# Patient Record
Sex: Male | Born: 1950 | Race: Black or African American | Hispanic: No | Marital: Single | Smoking: Never smoker
Health system: Southern US, Community
[De-identification: ages and names within clinical notes are randomized; demographics above are authoritative.]

## PROBLEM LIST (undated history)

## (undated) DIAGNOSIS — F419 Anxiety disorder, unspecified: Secondary | ICD-10-CM

## (undated) DIAGNOSIS — F0390 Unspecified dementia without behavioral disturbance: Secondary | ICD-10-CM

## (undated) DIAGNOSIS — I10 Essential (primary) hypertension: Secondary | ICD-10-CM

## (undated) DIAGNOSIS — F819 Developmental disorder of scholastic skills, unspecified: Secondary | ICD-10-CM

## (undated) DIAGNOSIS — R569 Unspecified convulsions: Secondary | ICD-10-CM

## (undated) HISTORY — PX: HIP SURGERY: SHX245

---

## 2015-11-27 ENCOUNTER — Emergency Department (HOSPITAL_BASED_OUTPATIENT_CLINIC_OR_DEPARTMENT_OTHER)
Admission: EM | Admit: 2015-11-27 | Discharge: 2015-11-27 | Disposition: A | Discharge status: Home / Self Care | Payer: Medicare Other | Attending: Emergency Medicine | Admitting: Emergency Medicine

## 2015-11-27 ENCOUNTER — Emergency Department (HOSPITAL_BASED_OUTPATIENT_CLINIC_OR_DEPARTMENT_OTHER): Payer: Medicare Other

## 2015-11-27 ENCOUNTER — Encounter (HOSPITAL_BASED_OUTPATIENT_CLINIC_OR_DEPARTMENT_OTHER): Payer: Self-pay | Admitting: *Deleted

## 2015-11-27 DIAGNOSIS — T17908A Unspecified foreign body in respiratory tract, part unspecified causing other injury, initial encounter: Secondary | ICD-10-CM

## 2015-11-27 DIAGNOSIS — Y929 Unspecified place or not applicable: Secondary | ICD-10-CM | POA: Insufficient documentation

## 2015-11-27 DIAGNOSIS — Y999 Unspecified external cause status: Secondary | ICD-10-CM | POA: Diagnosis not present

## 2015-11-27 DIAGNOSIS — Z79899 Other long term (current) drug therapy: Secondary | ICD-10-CM | POA: Insufficient documentation

## 2015-11-27 DIAGNOSIS — Y939 Activity, unspecified: Secondary | ICD-10-CM | POA: Diagnosis not present

## 2015-11-27 DIAGNOSIS — X58XXXA Exposure to other specified factors, initial encounter: Secondary | ICD-10-CM | POA: Insufficient documentation

## 2015-11-27 DIAGNOSIS — I10 Essential (primary) hypertension: Secondary | ICD-10-CM | POA: Diagnosis not present

## 2015-11-27 DIAGNOSIS — T17200A Unspecified foreign body in pharynx causing asphyxiation, initial encounter: Secondary | ICD-10-CM | POA: Insufficient documentation

## 2015-11-27 HISTORY — DX: Essential (primary) hypertension: I10

## 2015-11-27 HISTORY — DX: Developmental disorder of scholastic skills, unspecified: F81.9

## 2015-11-27 HISTORY — DX: Unspecified dementia, unspecified severity, without behavioral disturbance, psychotic disturbance, mood disturbance, and anxiety: F03.90

## 2015-11-27 HISTORY — DX: Anxiety disorder, unspecified: F41.9

## 2015-11-27 HISTORY — DX: Unspecified convulsions: R56.9

## 2015-11-27 MED ORDER — GLUCAGON HCL RDNA (DIAGNOSTIC) 1 MG IJ SOLR
1.0000 mg | Freq: Once | INTRAMUSCULAR | Status: AC
  Administered 2015-11-27: 1 mg via INTRAVENOUS
  Filled 2015-11-27: qty 1

## 2015-11-27 MED ORDER — PROPOFOL 10 MG/ML IV BOLUS
INTRAVENOUS | Status: AC
  Administered 2015-11-27: 18:00:00
  Filled 2015-11-27: qty 20

## 2015-11-27 MED ORDER — PROPOFOL 10 MG/ML IV BOLUS
INTRAVENOUS | Status: AC | PRN
Start: 2015-11-27 — End: 2015-11-27
  Administered 2015-11-27: 20 mg via INTRAVENOUS
  Administered 2015-11-27: 40 mg via INTRAVENOUS

## 2015-11-27 MED ORDER — GI COCKTAIL ~~LOC~~
30.0000 mL | Freq: Once | ORAL | Status: AC
  Administered 2015-11-27: 30 mL via ORAL
  Filled 2015-11-27: qty 30

## 2015-11-27 NOTE — ED Notes (Signed)
Pt tolerated ice cream. States he feels better. MD aware.

## 2015-11-27 NOTE — ED Notes (Signed)
Pt eating some ice cream

## 2015-11-27 NOTE — ED Notes (Signed)
Pt given water to drink. Tolerated well but states he still feels like something is in his throat. MD aware.

## 2015-11-27 NOTE — Sedation Documentation (Signed)
Unable to rate pain due to sedation procedure. Large piece of pork chop removed from airway

## 2015-11-27 NOTE — ED Notes (Signed)
MD with pt  

## 2015-11-27 NOTE — ED Provider Notes (Signed)
[facility]   CSN: [id number] Arrival date & time: 11/27/15  1718  First Provider Contact:  First MD Initiated Contact with Patient 11/27/15 1736   By signing my name below, I, Bridgette Habermann, attest that this documentation has been prepared under the direction and in the presence of Jacalyn Lefevre, MD. Electronically Signed: Bridgette Habermann, ED Scribe. 11/27/15. 5:41 PM.   History   Chief Complaint Chief Complaint  Patient presents with  . Airway Obstruction    HPI Comments: Hunter Chavez is a 65 y.o. male who presents to the Emergency Department complaining of a sudden onset, constant object stuck in his throat onset tonight. Pt also has associated cough. Pt states he was eating a pork chop this evening and got some stuck in his throat. Pt's voice is hoarse at this time. He denies vomiting, fever, or any other associated symptoms.  The history is provided by the patient. No language interpreter was used.   Past Medical History:  Diagnosis Date  . Anxiety   . Dementia   . Hypertension   . Learning disorder   . Seizures (HCC)     There are no active problems to display for this patient.  History reviewed. No pertinent surgical history.  Home Medications    Prior to Admission medications   Medication Sig Start Date End Date Taking? Authorizing Provider  diltiazem (CARDIZEM) 120 MG tablet Take 120 mg by mouth 4 (four) times daily.   Yes Historical Provider, MD  donepezil (ARICEPT) 10 MG tablet Take 10 mg by mouth at bedtime.   Yes Historical Provider, MD  famotidine (PEPCID) 10 MG tablet Take 10 mg by mouth 2 (two) times daily.   Yes Historical Provider, MD  levETIRAcetam (KEPPRA) 500 MG tablet Take 500 mg by mouth 2 (two) times daily.   Yes Historical Provider, MD  LORazepam (ATIVAN) 0.5 MG tablet Take 0.5 mg by mouth every 8 (eight) hours.   Yes Historical Provider, MD  pravastatin (PRAVACHOL) 20 MG tablet Take 20 mg by mouth daily.   Yes Historical  Provider, MD    Family History History reviewed. No pertinent family history.  Social History Social History  Substance Use Topics  . Smoking status: Never Smoker  . Smokeless tobacco: Never Used  . Alcohol use No   Allergies   Review of patient's allergies indicates no known allergies. Review of Systems Review of Systems  10 Systems reviewed and all are negative for acute change except as noted in the HPI. Physical Exam Updated Vital Signs BP 130/79 (BP Location: Left Arm)   Pulse 66   Temp 97.8 F (36.6 C) (Oral)   Resp 18   Ht  (1.626 m)   Wt 142 lb (64.4 kg)   SpO2 99%   BMI 24.37 kg/m   Physical Exam  Constitutional: He appears well-developed and well-nourished. He appears distressed.  HENT:  Head: Normocephalic.  Pt not handling secretions well  Eyes: Conjunctivae are normal.  Neck: Normal range of motion. Neck supple.  Cardiovascular: Normal rate.   Pulmonary/Chest: Effort normal. No respiratory distress.  Abdominal: Soft. Bowel sounds are normal. He exhibits no distension.  Musculoskeletal: Normal range of motion.  Neurological: He is alert.  Skin: Skin is warm and dry.  Psychiatric: He has a normal mood and affect. His behavior is normal.  Nursing note and vitals reviewed.  ED Treatments / Results  DIAGNOSTIC STUDIES: Oxygen Saturation is 93% on RA, poor by my interpretation.  COORDINATION OF CARE: 5:39 PM Discussed treatment plan with pt at bedside and pt agreed to plan.  Labs (all labs ordered are listed, but only abnormal results are displayed) Labs Reviewed - No data to display  EKG  EKG Interpretation None      Radiology Dg Chest Portable 1 View  Result Date: 11/27/2015 CLINICAL DATA:  Shortness of breath. Patient presented with object (Pork chop) stuck in throat, object has been removed. EXAM: PORTABLE CHEST 1 VIEW COMPARISON:  None. FINDINGS: Lung volumes are low. The patient is rotated to the right. Crowding of bronchovascular  markings with probable vascular congestion. Heart appears prominent in size, difficult to accurately assess due to positioning. No focal airspace disease. No large pleural effusion or pneumothorax. Gaseous distention of bowel loops in the upper abdomen is partially included. IMPRESSION: Low lung volumes with bronchovascular crowding and probable vascular congestion. Repeat PA and lateral views may be helpful when patient is able for accurate assessment of the cardiac size based on clinical concern. Electronically Signed   By: Rubye Oaks M.D.   On: 11/27/2015 18:23   Procedures .Foreign Body Removal Date/Time: 11/27/2015 6:58 PM Performed by: Jacalyn Lefevre Authorized by: Jacalyn Lefevre  Consent: Written consent obtained. Risks and benefits: risks, benefits and alternatives were discussed Consent given by: guardian Patient understanding: patient states understanding of the procedure being performed Patient consent: the patient's understanding of the procedure matches consent given Procedure consent: procedure consent matches procedure scheduled Relevant documents: relevant documents present and verified Test results: test results available and properly labeled Site marked: the operative site was marked Imaging studies: imaging studies available Required items: required blood products, implants, devices, and special equipment available Patient identity confirmed: verbally with patient and arm band Time out: Immediately prior to procedure a "time out" was called to verify the correct patient, procedure, equipment, support staff and site/side marked as required. Body area: throat  Sedation: Patient sedated: yes Sedation type: moderate (conscious) sedation Sedatives: propofol  Localization method: visualized Removal mechanism: ring forceps Complexity: complex 1 objects recovered. Objects recovered: large piece of pork chop from upper airway Post-procedure assessment: foreign body  removed Patient tolerance: Patient tolerated the procedure well with no immediate complications Comments: Please see nurse's notes for start and stop times. .Sedation Date/Time: 11/27/2015 7:00 PM Performed by: Jacalyn Lefevre Authorized by: Jacalyn Lefevre   Consent:    Consent obtained:  Written   Consent given by:  Guardian   Risks discussed:  Allergic reaction, dysrhythmia, inadequate sedation, nausea, prolonged hypoxia resulting in organ damage, prolonged sedation necessitating reversal, respiratory compromise necessitating ventilatory assistance and intubation and vomiting   Alternatives discussed:  Analgesia without sedation Universal protocol:    Procedure explained and questions answered to patient or proxy's satisfaction: yes     Relevant documents present and verified: yes     Test results available and properly labeled: yes     Imaging studies available: yes     Required blood products, implants, devices, and special equipment available: yes     Site/side marked: yes     Immediately prior to procedure a time out was called: yes     Patient identity confirmation method:  Verbally with patient Indications:    Procedure performed:  Foreign body removal   Procedure necessitating sedation performed by:  Physician performing sedation   Intended level of sedation:  Moderate (conscious sedation) Pre-sedation assessment:    NPO status caution: urgency dictates proceeding with non-ideal NPO status   Immediate pre-procedure details:  Reassessment: Patient reassessed immediately prior to procedure     Reviewed: vital signs and relevant labs/tests     Verified: bag valve mask available, emergency equipment available, intubation equipment available, IV patency confirmed and oxygen available   Post-procedure details:    Patient tolerance:  Tolerated well, no immediate complications Comments:     Please see nurse's note for start and stop times.   (including critical care  time)  Medications Ordered in ED Medications  propofol (DIPRIVAN) 10 mg/mL bolus/IV push (  New Bag/Given 11/27/15 1818)  propofol (DIPRIVAN) 10 mg/mL bolus/IV push (20 mg Intravenous New Bag/Given 11/27/15 1758)  glucagon (human recombinant) (GLUCAGEN) injection 1 mg (1 mg Intravenous Given 11/27/15 1845)  gi cocktail (Maalox,Lidocaine,Donnatal) (30 mLs Oral Given 11/27/15 1910)     Initial Impression / Assessment and Plan / ED Course  I have reviewed the triage vital signs and the nursing notes.  Pertinent labs & imaging results that were available during my care of the patient were reviewed by me and considered in my medical decision making (see chart for details).  Clinical Course   After I removed the pork chop from pt's airway, I looked with the glide scope and did not see any residual pork chop.   When pt woke up, he felt like something was still stuck.  I was confident it was not in the airway, so we gave him glucagon and a gi cocktail.  The pt was then able to swallow normally.  He has been able to eat ice cream and drink fluids.  He is now singing in the room and said that he feels good.  He is in no resp distress.  He is ok for d/c.  Pt is encouraged to chew food thoroughly.   Final Clinical Impressions(s) / ED Diagnoses   Final diagnoses:  Airway obstruction due to foreign body, initial encounter    New Prescriptions New Prescriptions   No medications on file  I personally performed the services described in this documentation, which was scribed in my presence. The recorded information has been reviewed and is accurate.     Jacalyn Lefevre, MD 11/27/15 2001

## 2015-11-27 NOTE — Sedation Documentation (Signed)
Portable xray at bedside. Family at bedside during procedure.

## 2015-11-27 NOTE — ED Triage Notes (Signed)
Pt from home.  States that he was eating a pork chop earlier tonight and got some stuck in his throat.  pts voice hoarse.  Pt is able to swallow liquids but appears to have difficulty.  Pt coughing.

## 2019-05-08 ENCOUNTER — Encounter (HOSPITAL_BASED_OUTPATIENT_CLINIC_OR_DEPARTMENT_OTHER): Payer: Self-pay

## 2019-05-08 ENCOUNTER — Other Ambulatory Visit: Payer: Self-pay

## 2019-05-08 ENCOUNTER — Emergency Department (HOSPITAL_BASED_OUTPATIENT_CLINIC_OR_DEPARTMENT_OTHER): Payer: Medicare Other

## 2019-05-08 ENCOUNTER — Emergency Department (HOSPITAL_BASED_OUTPATIENT_CLINIC_OR_DEPARTMENT_OTHER)
Admission: EM | Admit: 2019-05-08 | Discharge: 2019-05-09 | Payer: Medicare Other | Attending: Emergency Medicine | Admitting: Emergency Medicine

## 2019-05-08 DIAGNOSIS — I10 Essential (primary) hypertension: Secondary | ICD-10-CM | POA: Insufficient documentation

## 2019-05-08 DIAGNOSIS — Y999 Unspecified external cause status: Secondary | ICD-10-CM | POA: Diagnosis not present

## 2019-05-08 DIAGNOSIS — Y939 Activity, unspecified: Secondary | ICD-10-CM | POA: Insufficient documentation

## 2019-05-08 DIAGNOSIS — S72114A Nondisplaced fracture of greater trochanter of right femur, initial encounter for closed fracture: Secondary | ICD-10-CM | POA: Diagnosis not present

## 2019-05-08 DIAGNOSIS — F0390 Unspecified dementia without behavioral disturbance: Secondary | ICD-10-CM | POA: Insufficient documentation

## 2019-05-08 DIAGNOSIS — S79911A Unspecified injury of right hip, initial encounter: Secondary | ICD-10-CM | POA: Diagnosis present

## 2019-05-08 DIAGNOSIS — W1811XA Fall from or off toilet without subsequent striking against object, initial encounter: Secondary | ICD-10-CM | POA: Insufficient documentation

## 2019-05-08 DIAGNOSIS — Z79899 Other long term (current) drug therapy: Secondary | ICD-10-CM | POA: Insufficient documentation

## 2019-05-08 DIAGNOSIS — Y929 Unspecified place or not applicable: Secondary | ICD-10-CM | POA: Diagnosis not present

## 2019-05-08 DIAGNOSIS — S72001A Fracture of unspecified part of neck of right femur, initial encounter for closed fracture: Secondary | ICD-10-CM

## 2019-05-08 LAB — CBC WITH DIFFERENTIAL/PLATELET
Abs Immature Granulocytes: 0.02 10*3/uL (ref 0.00–0.07)
Basophils Absolute: 0 10*3/uL (ref 0.0–0.1)
Basophils Relative: 0 %
Eosinophils Absolute: 0.2 10*3/uL (ref 0.0–0.5)
Eosinophils Relative: 4 %
HCT: 41.2 % (ref 39.0–52.0)
Hemoglobin: 12.5 g/dL — ABNORMAL LOW (ref 13.0–17.0)
Immature Granulocytes: 0 %
Lymphocytes Relative: 30 %
Lymphs Abs: 1.9 10*3/uL (ref 0.7–4.0)
MCH: 27.2 pg (ref 26.0–34.0)
MCHC: 30.3 g/dL (ref 30.0–36.0)
MCV: 89.8 fL (ref 80.0–100.0)
Monocytes Absolute: 0.9 10*3/uL (ref 0.1–1.0)
Monocytes Relative: 14 %
Neutro Abs: 3.2 10*3/uL (ref 1.7–7.7)
Neutrophils Relative %: 52 %
Platelets: 107 10*3/uL — ABNORMAL LOW (ref 150–400)
RBC: 4.59 MIL/uL (ref 4.22–5.81)
RDW: 13.2 % (ref 11.5–15.5)
WBC: 6.2 10*3/uL (ref 4.0–10.5)
nRBC: 0 % (ref 0.0–0.2)

## 2019-05-08 LAB — BASIC METABOLIC PANEL
Anion gap: 10 (ref 5–15)
BUN: 15 mg/dL (ref 8–23)
CO2: 25 mmol/L (ref 22–32)
Calcium: 9.4 mg/dL (ref 8.9–10.3)
Chloride: 109 mmol/L (ref 98–111)
Creatinine, Ser: 1.19 mg/dL (ref 0.61–1.24)
GFR calc Af Amer: >60 mL/min (ref >60)
GFR calc non Af Amer: >60 mL/min (ref >60)
Glucose, Bld: 107 mg/dL — ABNORMAL HIGH (ref 70–99)
Potassium: 4.1 mmol/L (ref 3.5–5.1)
Sodium: 144 mmol/L (ref 135–145)

## 2019-05-08 MED ORDER — FENTANYL CITRATE (PF) 100 MCG/2ML IJ SOLN
50.0000 ug | Freq: Once | INTRAMUSCULAR | Status: AC
  Administered 2019-05-08: 50 ug via INTRAVENOUS
  Filled 2019-05-08: qty 2

## 2019-05-08 NOTE — ED Notes (Signed)
Patient transported to X-ray 

## 2019-05-08 NOTE — ED Triage Notes (Addendum)
Pt was being assisted to toilet by care giver and fell to hit his R side. Pt is non-verbal, but follows direction. Caregiver denies that pt hit his head. No LOC.

## 2019-05-08 NOTE — ED Provider Notes (Signed)
[facility] EMERGENCY DEPARTMENT Provider Note   CSN: [id number] Arrival date & time: 05/08/19  1606     History Chief Complaint  Patient presents with  . Fall    Hunter Chavez is a 69 y.o. male.  HPI Patient presents to the emergency department with injuries following a fall.  The patient's sister gives me the history as he is nonverbal.  The patient was attempting to use the bathroom when he missed the toilet and he started to fall and his sister tried to catch him but was unable to do so and he fell landing on his right hip.  The patient was unable to stand after the incident.  The sister states that he did not hit his head.  And did not lose consciousness.    Past Medical History:  Diagnosis Date  . Anxiety   . Dementia ([place])   . Hypertension   . Learning disorder   . Seizures ([place])     There are no problems to display for this patient.   History reviewed. No pertinent surgical history.     No family history on file.  Social History   Tobacco Use  . Smoking status: Never Smoker  . Smokeless tobacco: Never Used  Substance Use Topics  . Alcohol use: No  . Drug use: No    Home Medications Prior to Admission medications   Medication Sig Start Date End Date Taking? Authorizing Provider  diltiazem (CARDIZEM) 120 MG tablet Take 120 mg by mouth 4 (four) times daily.    [provider]  donepezil (ARICEPT) 10 MG tablet Take 10 mg by mouth at bedtime.    [provider]  famotidine (PEPCID) 10 MG tablet Take 10 mg by mouth 2 (two) times daily.    [provider]  levETIRAcetam (KEPPRA) 500 MG tablet Take 500 mg by mouth 2 (two) times daily.    [provider]  LORazepam (ATIVAN) 0.5 MG tablet Take 0.5 mg by mouth every 8 (eight) hours.    [provider]  pravastatin (PRAVACHOL) 20 MG tablet Take 20 mg by mouth daily.    [provider]    Allergies    Patient has no known allergies.  Review of  Systems   Review of Systems Level 5 caveat applies due to nonverbal chronic condition Physical Exam Updated Vital Signs BP (!) 157/65 (BP Location: Right Arm)   Pulse (!) 58   Temp 98 F (36.7 C) (Tympanic)   Resp 20   Ht 5\' 4"  (1.626 m)   Wt 49.9 kg   SpO2 99%   BMI 18.88 kg/m   Physical Exam Vitals and nursing note reviewed.  Constitutional:      General: He is not in acute distress.    Appearance: He is well-developed.  HENT:     Head: Normocephalic and atraumatic.  Eyes:     Pupils: Pupils are equal, round, and reactive to light.  Cardiovascular:     Rate and Rhythm: Normal rate and regular rhythm.     Heart sounds: Normal heart sounds. No murmur. No friction rub. No gallop.   Pulmonary:     Effort: Pulmonary effort is normal. No respiratory distress.     Breath sounds: Normal breath sounds. No wheezing.  Abdominal:     General: Bowel sounds are normal. There is no distension.     Palpations: Abdomen is soft.     Tenderness: There is no abdominal tenderness.  Musculoskeletal:  Cervical back: Normal range of motion and neck supple.     Right hip: Tenderness and bony tenderness present.  Skin:    General: Skin is warm and dry.     Capillary Refill: Capillary refill takes less than 2 seconds.     Findings: No erythema or rash.  Neurological:     Mental Status: He is alert. Mental status is at baseline.     Motor: No abnormal muscle tone.     Coordination: Coordination normal.  Psychiatric:        Behavior: Behavior normal.     ED Results / Procedures / Treatments   Labs (all labs ordered are listed, but only abnormal results are displayed) Labs Reviewed  BASIC METABOLIC PANEL - Abnormal; Notable for the following components:      Result Value   Glucose, Bld 107 (*)    All other components within normal limits  CBC WITH DIFFERENTIAL/PLATELET - Abnormal; Notable for the following components:   Hemoglobin 12.5 (*)    Platelets 107 (*)    All other  components within normal limits    EKG None  Radiology DG Hip Unilat W or Wo Pelvis 2-3 Views Right  Result Date: 05/08/2019 CLINICAL DATA:  Fall, hip pain EXAM: DG HIP (WITH OR WITHOUT PELVIS) 2-3V RIGHT COMPARISON:  None. FINDINGS: There is a linear lucency seen through the greater trochanter, likely nondisplaced fracture. 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 Normal bone mineralization seen throughout. IMPRESSION: Nondisplaced superior greater trochanter fracture. Electronically Signed   By: Jonna Clark M.D.   On: 05/08/2019 20:11    Procedures Procedures (including critical care time)  Medications Ordered in ED Medications - No data to display  ED Course  I have reviewed the triage vital signs and the nursing notes.  Pertinent labs & imaging results that were available during my care of the patient were reviewed by me and considered in my medical decision making (see chart for details).    MDM Rules/Calculators/A&P                     I spoke with who is on-call for the patient's orthopedist at [facility] and they will accept him in transfer to the emergency department.  Spoke with the emergency department who coordinated with the surgeon and would like for him brought to the ER for surgery tonight.  Final Clinical Impression(s) / ED Diagnoses Final diagnoses:  None    Rx / DC Orders ED Discharge Orders    None       Charlestine Night, PA-C 05/08/19 2255    Little, Ambrose Finland, MD 05/09/19 1821

## 2019-11-25 ENCOUNTER — Emergency Department (HOSPITAL_BASED_OUTPATIENT_CLINIC_OR_DEPARTMENT_OTHER): Payer: Medicare Other

## 2019-11-25 ENCOUNTER — Encounter (HOSPITAL_BASED_OUTPATIENT_CLINIC_OR_DEPARTMENT_OTHER): Payer: Self-pay

## 2019-11-25 ENCOUNTER — Other Ambulatory Visit: Payer: Self-pay

## 2019-11-25 ENCOUNTER — Inpatient Hospital Stay (HOSPITAL_BASED_OUTPATIENT_CLINIC_OR_DEPARTMENT_OTHER)
Admission: EM | Admit: 2019-11-25 | Discharge: 2019-11-29 | DRG: 177 | Disposition: A | Discharge status: Home / Self Care | Payer: Medicare Other | Attending: Internal Medicine | Admitting: Internal Medicine

## 2019-11-25 DIAGNOSIS — G2111 Neuroleptic induced parkinsonism: Secondary | ICD-10-CM | POA: Diagnosis present

## 2019-11-25 DIAGNOSIS — I10 Essential (primary) hypertension: Secondary | ICD-10-CM | POA: Diagnosis present

## 2019-11-25 DIAGNOSIS — G20 Parkinson's disease: Secondary | ICD-10-CM | POA: Diagnosis present

## 2019-11-25 DIAGNOSIS — J690 Pneumonitis due to inhalation of food and vomit: Secondary | ICD-10-CM | POA: Diagnosis present

## 2019-11-25 DIAGNOSIS — F0280 Dementia in other diseases classified elsewhere without behavioral disturbance: Secondary | ICD-10-CM | POA: Diagnosis present

## 2019-11-25 DIAGNOSIS — F0390 Unspecified dementia without behavioral disturbance: Secondary | ICD-10-CM | POA: Diagnosis present

## 2019-11-25 DIAGNOSIS — L89101 Pressure ulcer of unspecified part of back, stage 1: Secondary | ICD-10-CM

## 2019-11-25 DIAGNOSIS — T43505A Adverse effect of unspecified antipsychotics and neuroleptics, initial encounter: Secondary | ICD-10-CM | POA: Diagnosis present

## 2019-11-25 DIAGNOSIS — N179 Acute kidney failure, unspecified: Secondary | ICD-10-CM | POA: Diagnosis present

## 2019-11-25 DIAGNOSIS — L8990 Pressure ulcer of unspecified site, unspecified stage: Secondary | ICD-10-CM | POA: Insufficient documentation

## 2019-11-25 DIAGNOSIS — J9601 Acute respiratory failure with hypoxia: Secondary | ICD-10-CM | POA: Diagnosis present

## 2019-11-25 DIAGNOSIS — Z833 Family history of diabetes mellitus: Secondary | ICD-10-CM

## 2019-11-25 DIAGNOSIS — R739 Hyperglycemia, unspecified: Secondary | ICD-10-CM | POA: Diagnosis not present

## 2019-11-25 DIAGNOSIS — F419 Anxiety disorder, unspecified: Secondary | ICD-10-CM | POA: Diagnosis present

## 2019-11-25 DIAGNOSIS — F29 Unspecified psychosis not due to a substance or known physiological condition: Secondary | ICD-10-CM | POA: Diagnosis present

## 2019-11-25 DIAGNOSIS — Z20822 Contact with and (suspected) exposure to covid-19: Secondary | ICD-10-CM | POA: Diagnosis present

## 2019-11-25 DIAGNOSIS — Z8249 Family history of ischemic heart disease and other diseases of the circulatory system: Secondary | ICD-10-CM

## 2019-11-25 DIAGNOSIS — R1110 Vomiting, unspecified: Secondary | ICD-10-CM | POA: Diagnosis present

## 2019-11-25 DIAGNOSIS — G40909 Epilepsy, unspecified, not intractable, without status epilepticus: Secondary | ICD-10-CM | POA: Diagnosis present

## 2019-11-25 DIAGNOSIS — E876 Hypokalemia: Secondary | ICD-10-CM | POA: Diagnosis present

## 2019-11-25 DIAGNOSIS — L89151 Pressure ulcer of sacral region, stage 1: Secondary | ICD-10-CM | POA: Diagnosis present

## 2019-11-25 DIAGNOSIS — Z79899 Other long term (current) drug therapy: Secondary | ICD-10-CM

## 2019-11-25 DIAGNOSIS — R0902 Hypoxemia: Secondary | ICD-10-CM

## 2019-11-25 DIAGNOSIS — E869 Volume depletion, unspecified: Secondary | ICD-10-CM | POA: Diagnosis present

## 2019-11-25 DIAGNOSIS — F79 Unspecified intellectual disabilities: Secondary | ICD-10-CM | POA: Diagnosis present

## 2019-11-25 LAB — CBC WITH DIFFERENTIAL/PLATELET
Abs Immature Granulocytes: 0.14 10*3/uL — ABNORMAL HIGH (ref 0.00–0.07)
Basophils Absolute: 0 10*3/uL (ref 0.0–0.1)
Basophils Relative: 0 %
Eosinophils Absolute: 0 10*3/uL (ref 0.0–0.5)
Eosinophils Relative: 0 %
HCT: 44.3 % (ref 39.0–52.0)
Hemoglobin: 13.7 g/dL (ref 13.0–17.0)
Immature Granulocytes: 1 %
Lymphocytes Relative: 8 %
Lymphs Abs: 1 10*3/uL (ref 0.7–4.0)
MCH: 26.7 pg (ref 26.0–34.0)
MCHC: 30.9 g/dL (ref 30.0–36.0)
MCV: 86.4 fL (ref 80.0–100.0)
Monocytes Absolute: 0.6 10*3/uL (ref 0.1–1.0)
Monocytes Relative: 4 %
Neutro Abs: 11.3 10*3/uL — ABNORMAL HIGH (ref 1.7–7.7)
Neutrophils Relative %: 87 %
Platelets: 160 10*3/uL (ref 150–400)
RBC: 5.13 MIL/uL (ref 4.22–5.81)
RDW: 12.5 % (ref 11.5–15.5)
WBC: 13 10*3/uL — ABNORMAL HIGH (ref 4.0–10.5)
nRBC: 0 % (ref 0.0–0.2)

## 2019-11-25 LAB — BASIC METABOLIC PANEL
Anion gap: 14 (ref 5–15)
BUN: 18 mg/dL (ref 8–23)
CO2: 27 mmol/L (ref 22–32)
Calcium: 9.6 mg/dL (ref 8.9–10.3)
Chloride: 100 mmol/L (ref 98–111)
Creatinine, Ser: 1.49 mg/dL — ABNORMAL HIGH (ref 0.61–1.24)
GFR calc Af Amer: 56 mL/min — ABNORMAL LOW (ref >60)
GFR calc non Af Amer: 48 mL/min — ABNORMAL LOW (ref >60)
Glucose, Bld: 197 mg/dL — ABNORMAL HIGH (ref 70–99)
Potassium: 3.9 mmol/L (ref 3.5–5.1)
Sodium: 141 mmol/L (ref 135–145)

## 2019-11-25 LAB — I-STAT ARTERIAL BLOOD GAS, ED
Acid-Base Excess: 0 mmol/L (ref 0.0–2.0)
Bicarbonate: 27.4 mmol/L (ref 20.0–28.0)
Calcium, Ion: 1.35 mmol/L (ref 1.15–1.40)
HCT: 40 % (ref 39.0–52.0)
Hemoglobin: 13.6 g/dL (ref 13.0–17.0)
O2 Saturation: 86 %
Patient temperature: 97.6
Potassium: 3.7 mmol/L (ref 3.5–5.1)
Sodium: 143 mmol/L (ref 135–145)
TCO2: 29 mmol/L (ref 22–32)
pCO2 arterial: 54.4 mmHg — ABNORMAL HIGH (ref 32.0–48.0)
pH, Arterial: 7.307 — ABNORMAL LOW (ref 7.350–7.450)
pO2, Arterial: 56 mmHg — ABNORMAL LOW (ref 83.0–108.0)

## 2019-11-25 LAB — LACTIC ACID, PLASMA: Lactic Acid, Venous: 2.3 mmol/L (ref 0.5–1.9)

## 2019-11-25 MED ORDER — METOCLOPRAMIDE HCL 5 MG/ML IJ SOLN
10.0000 mg | Freq: Once | INTRAMUSCULAR | Status: AC
  Administered 2019-11-25: 10 mg via INTRAVENOUS
  Filled 2019-11-25: qty 2

## 2019-11-25 MED ORDER — DIPHENHYDRAMINE HCL 50 MG/ML IJ SOLN
12.5000 mg | Freq: Once | INTRAMUSCULAR | Status: AC
  Administered 2019-11-25: 12.5 mg via INTRAVENOUS
  Filled 2019-11-25: qty 1

## 2019-11-25 MED ORDER — LIDOCAINE VISCOUS HCL 2 % MT SOLN
15.0000 mL | Freq: Once | OROMUCOSAL | Status: AC
Start: 2019-11-25 — End: 2019-11-25
  Administered 2019-11-25: 15 mL via ORAL
  Filled 2019-11-25: qty 15

## 2019-11-25 MED ORDER — SODIUM CHLORIDE 0.9 % IV SOLN
3.0000 g | Freq: Once | INTRAVENOUS | Status: AC
  Administered 2019-11-25: 3 g via INTRAVENOUS
  Filled 2019-11-25: qty 8

## 2019-11-25 MED ORDER — LEVOFLOXACIN 500 MG PO TABS
500.0000 mg | ORAL_TABLET | Freq: Once | ORAL | Status: AC
  Administered 2019-11-25: 500 mg via ORAL
  Filled 2019-11-25: qty 1

## 2019-11-25 MED ORDER — SODIUM CHLORIDE 0.9 % IV BOLUS
1000.0000 mL | Freq: Once | INTRAVENOUS | Status: AC
  Administered 2019-11-25: 1000 mL via INTRAVENOUS

## 2019-11-25 MED ORDER — ALUM & MAG HYDROXIDE-SIMETH 200-200-20 MG/5ML PO SUSP
30.0000 mL | Freq: Once | ORAL | Status: AC
  Administered 2019-11-25: 30 mL via ORAL
  Filled 2019-11-25: qty 30

## 2019-11-25 MED ORDER — GLUCAGON HCL RDNA (DIAGNOSTIC) 1 MG IJ SOLR
1.0000 mg | Freq: Once | INTRAMUSCULAR | Status: AC
  Administered 2019-11-25: 1 mg via INTRAVENOUS
  Filled 2019-11-25: qty 1

## 2019-11-25 NOTE — ED Notes (Addendum)
Date and time results received: 11/25/19 (use smartphrase ".now" to insert current time)  Test: lactic Critical Value: 2.3  Name of Provider Notified:[place]

## 2019-11-25 NOTE — ED Notes (Signed)
Patient tolerated PO challenge well.  

## 2019-11-25 NOTE — ED Provider Notes (Signed)
[facility] EMERGENCY DEPARTMENT Provider Note   CSN: [id number] Arrival date & time: 11/25/19  2016     History No chief complaint on file.   Hunter Chavez is a 69 y.o. male history of Parkinson's, hypertension, chronic aspiration here presenting with possible aspiration or choking event.  Patient was feeding himself a hot dog and apparently did not chew his food and may have aspirated.  Patient apparently had previous aspiration events.  Patient had no fever at home.  The history is provided by the patient and a relative.       Past Medical History:  Diagnosis Date  . Anxiety   . Dementia (HCC)   . Hypertension   . Learning disorder   . Seizures (HCC)     There are no problems to display for this patient.   History reviewed. No pertinent surgical history.     No family history on file.  Social History   Tobacco Use  . Smoking status: Never Smoker  . Smokeless tobacco: Never Used  Vaping Use  . Vaping Use: Never used  Substance Use Topics  . Alcohol use: No  . Drug use: No    Home Medications Prior to Admission medications   Medication Sig Start Date End Date Taking? Authorizing Provider  diltiazem (CARDIZEM) 120 MG tablet Take 120 mg by mouth 4 (four) times daily.    [provider]  donepezil (ARICEPT) 10 MG tablet Take 10 mg by mouth at bedtime.    [provider]  famotidine (PEPCID) 10 MG tablet Take 10 mg by mouth 2 (two) times daily.    [provider]  levETIRAcetam (KEPPRA) 500 MG tablet Take 500 mg by mouth 2 (two) times daily.    [provider]  LORazepam (ATIVAN) 0.5 MG tablet Take 0.5 mg by mouth every 8 (eight) hours.    [provider]  pravastatin (PRAVACHOL) 20 MG tablet Take 20 mg by mouth daily.    [provider]    Allergies    Patient has no known allergies.  Review of Systems   Review of Systems  Respiratory: Positive for choking.   All other systems reviewed and  are negative.   Physical Exam Updated Vital Signs BP (!) 150/82 (BP Location: Right Arm)   Temp 97.6 F (36.4 C) (Tympanic)   Resp 20   Wt 47.8 kg   SpO2 99%   BMI 18.09 kg/m   Physical Exam Vitals and nursing note reviewed.  Constitutional:      Comments: Chronically ill, gurgling.  HENT:     Head: Normocephalic.     Nose: Nose normal.     Mouth/Throat:     Comments: No obvious foreign body in posterior pharynx. Eyes:     Extraocular Movements: Extraocular movements intact.     Pupils: Pupils are equal, round, and reactive to light.  Cardiovascular:     Rate and Rhythm: Normal rate and regular rhythm.     Pulses: Normal pulses.     Heart sounds: Normal heart sounds.  Pulmonary:     Effort: Pulmonary effort is normal.     Comments: Crackles on right lung base. Abdominal:     General: Abdomen is flat.     Palpations: Abdomen is soft.  Musculoskeletal:        General: Normal range of motion.     Cervical back: Normal range of motion and neck supple.  Skin:    General: Skin is warm.  Capillary Refill: Capillary refill takes less than 2 seconds.  Neurological:     Mental Status: He is alert.     Comments: Some baseline tremors.  Patient is minimally verbal.  Psychiatric:        Mood and Affect: Mood normal.     ED Results / Procedures / Treatments   Labs (all labs ordered are listed, but only abnormal results are displayed) Labs Reviewed  CBC WITH DIFFERENTIAL/PLATELET - Abnormal; Notable for the following components:      Result Value   WBC 13.0 (*)    Neutro Abs 11.3 (*)    Abs Immature Granulocytes 0.14 (*)    All other components within normal limits  BASIC METABOLIC PANEL - Abnormal; Notable for the following components:   Glucose, Bld 197 (*)    Creatinine, Ser 1.49 (*)    GFR calc non Af Amer 48 (*)    GFR calc Af Amer 56 (*)    All other components within normal limits    EKG None  Radiology No results found.  Procedures Procedures  (including critical care time)  CRITICAL CARE Performed by: Richardean Canal   Total critical care time: 30 minutes  Critical care time was exclusive of separately billable procedures and treating other patients.  Critical care was necessary to treat or prevent imminent or life-threatening deterioration.  Critical care was time spent personally by me on the following activities: development of treatment plan with patient and/or surrogate as well as nursing, discussions with consultants, evaluation of patient's response to treatment, examination of patient, obtaining history from patient or surrogate, ordering and performing treatments and interventions, ordering and review of laboratory studies, ordering and review of radiographic studies, pulse oximetry and re-evaluation of patient's condition.   Medications Ordered in ED Medications  alum & mag hydroxide-simeth (MAALOX/MYLANTA) 200-200-20 MG/5ML suspension 30 mL (has no administration in time range)    And  lidocaine (XYLOCAINE) 2 % viscous mouth solution 15 mL (has no administration in time range)  glucagon (human recombinant) (GLUCAGEN) injection 1 mg (1 mg Intravenous Given 11/25/19 2104)  metoCLOPramide (REGLAN) injection 10 mg (10 mg Intravenous Given 11/25/19 2104)  diphenhydrAMINE (BENADRYL) injection 12.5 mg (12.5 mg Intravenous Given 11/25/19 2104)  sodium chloride 0.9 % bolus 1,000 mL (1,000 mLs Intravenous New Bag/Given 11/25/19 2123)    ED Course  I have reviewed the triage vital signs and the nursing notes.  Pertinent labs & imaging results that were available during my care of the patient were reviewed by me and considered in my medical decision making (see chart for details).    MDM Rules/Calculators/A&P                          Hunter Chavez is a 69 y.o. male here presenting with cough and choking event.  Patient does have history of aspiration previously.  I wonder if he had another aspiration event or had globus  sensation or had food impaction.  We will try to get a chest x-ray to rule out aspiration.  We will get CBC and BMP as well.  We will try to give some glucagon and Reglan and p.o. trial.  10:30 pm Patient started desatted into the past 78%.  Patient is able to swallow however.  His chest x-ray showed bilateral pneumonia.  His white blood cell count is elevated at 13.  I think he likely has chronic aspiration and aspirated again today.  Family requested [facility] so we will page [facility].  11:15 pm  I talked to [facility] hospitalist (Dr. Luciano Cutter).  There is no beds at [facility].  Will admit to hospitalist in the [facility] system    Final Clinical Impression(s) / ED Diagnoses Final diagnoses:  None    Rx / DC Orders ED Discharge Orders    None       Charlynne Pander, MD 11/25/19 2345

## 2019-11-25 NOTE — ED Notes (Signed)
  Patient SPO2 was reading low on central monitor.  Went to assess patient and he was tachypneic and SPO2 was 78% on room air.  Tried adjusted patient and put him on 3 L O2 via [place].  SPO2 came up to 92-94% on O2.  Dr Silverio Lay was notified and went to assess patient.  Brett Canales RT also came to evaluate patient.

## 2019-11-25 NOTE — Progress Notes (Signed)
Patient's ABG  O2 saturation on 3 liter nasal is 86%.  Placed patient on 10 liter high flo nasal cannula.  Patient's SPO2 with a good waveform has increased to 96%.  MD aware.  RT will continue to monitor.

## 2019-11-25 NOTE — ED Triage Notes (Addendum)
Per pt's brother in law/pt's sister is his POA- pt may have choked on hot dog or has hot dog stuck in throat ~630pm-pt NAD-slow steady gait/baseline

## 2019-11-26 ENCOUNTER — Encounter (HOSPITAL_COMMUNITY): Payer: Self-pay | Admitting: Internal Medicine

## 2019-11-26 ENCOUNTER — Inpatient Hospital Stay (HOSPITAL_COMMUNITY): Payer: Medicare Other

## 2019-11-26 DIAGNOSIS — R1110 Vomiting, unspecified: Secondary | ICD-10-CM | POA: Diagnosis present

## 2019-11-26 DIAGNOSIS — T43505A Adverse effect of unspecified antipsychotics and neuroleptics, initial encounter: Secondary | ICD-10-CM | POA: Diagnosis present

## 2019-11-26 DIAGNOSIS — I10 Essential (primary) hypertension: Secondary | ICD-10-CM | POA: Diagnosis present

## 2019-11-26 DIAGNOSIS — G20 Parkinson's disease: Secondary | ICD-10-CM

## 2019-11-26 DIAGNOSIS — F0390 Unspecified dementia without behavioral disturbance: Secondary | ICD-10-CM | POA: Diagnosis not present

## 2019-11-26 DIAGNOSIS — J690 Pneumonitis due to inhalation of food and vomit: Secondary | ICD-10-CM | POA: Diagnosis present

## 2019-11-26 DIAGNOSIS — F79 Unspecified intellectual disabilities: Secondary | ICD-10-CM | POA: Diagnosis present

## 2019-11-26 DIAGNOSIS — E869 Volume depletion, unspecified: Secondary | ICD-10-CM | POA: Diagnosis present

## 2019-11-26 DIAGNOSIS — Z8249 Family history of ischemic heart disease and other diseases of the circulatory system: Secondary | ICD-10-CM | POA: Diagnosis not present

## 2019-11-26 DIAGNOSIS — G20A1 Parkinson's disease without dyskinesia, without mention of fluctuations: Secondary | ICD-10-CM | POA: Diagnosis present

## 2019-11-26 DIAGNOSIS — Z833 Family history of diabetes mellitus: Secondary | ICD-10-CM | POA: Diagnosis not present

## 2019-11-26 DIAGNOSIS — L89151 Pressure ulcer of sacral region, stage 1: Secondary | ICD-10-CM | POA: Diagnosis present

## 2019-11-26 DIAGNOSIS — R739 Hyperglycemia, unspecified: Secondary | ICD-10-CM | POA: Diagnosis not present

## 2019-11-26 DIAGNOSIS — Z20822 Contact with and (suspected) exposure to covid-19: Secondary | ICD-10-CM | POA: Diagnosis present

## 2019-11-26 DIAGNOSIS — N179 Acute kidney failure, unspecified: Secondary | ICD-10-CM | POA: Diagnosis present

## 2019-11-26 DIAGNOSIS — G2111 Neuroleptic induced parkinsonism: Secondary | ICD-10-CM | POA: Diagnosis present

## 2019-11-26 DIAGNOSIS — F419 Anxiety disorder, unspecified: Secondary | ICD-10-CM | POA: Diagnosis present

## 2019-11-26 DIAGNOSIS — Z79899 Other long term (current) drug therapy: Secondary | ICD-10-CM | POA: Diagnosis not present

## 2019-11-26 DIAGNOSIS — J9601 Acute respiratory failure with hypoxia: Secondary | ICD-10-CM

## 2019-11-26 DIAGNOSIS — F29 Unspecified psychosis not due to a substance or known physiological condition: Secondary | ICD-10-CM | POA: Diagnosis present

## 2019-11-26 DIAGNOSIS — E876 Hypokalemia: Secondary | ICD-10-CM | POA: Diagnosis present

## 2019-11-26 DIAGNOSIS — G40909 Epilepsy, unspecified, not intractable, without status epilepticus: Secondary | ICD-10-CM | POA: Diagnosis present

## 2019-11-26 DIAGNOSIS — F0280 Dementia in other diseases classified elsewhere without behavioral disturbance: Secondary | ICD-10-CM | POA: Diagnosis present

## 2019-11-26 DIAGNOSIS — L89101 Pressure ulcer of unspecified part of back, stage 1: Secondary | ICD-10-CM

## 2019-11-26 DIAGNOSIS — L8990 Pressure ulcer of unspecified site, unspecified stage: Secondary | ICD-10-CM | POA: Insufficient documentation

## 2019-11-26 LAB — HIV ANTIBODY (ROUTINE TESTING W REFLEX): HIV Screen 4th Generation wRfx: NONREACTIVE

## 2019-11-26 LAB — COMPREHENSIVE METABOLIC PANEL
ALT: 28 U/L (ref 0–44)
AST: 28 U/L (ref 15–41)
Albumin: 3.3 g/dL — ABNORMAL LOW (ref 3.5–5.0)
Alkaline Phosphatase: 69 U/L (ref 38–126)
Anion gap: 10 (ref 5–15)
BUN: 14 mg/dL (ref 8–23)
CO2: 24 mmol/L (ref 22–32)
Calcium: 8.9 mg/dL (ref 8.9–10.3)
Chloride: 107 mmol/L (ref 98–111)
Creatinine, Ser: 1.3 mg/dL — ABNORMAL HIGH (ref 0.61–1.24)
GFR calc Af Amer: >60 mL/min (ref >60)
GFR calc non Af Amer: 57 mL/min — ABNORMAL LOW (ref >60)
Glucose, Bld: 156 mg/dL — ABNORMAL HIGH (ref 70–99)
Potassium: 3.4 mmol/L — ABNORMAL LOW (ref 3.5–5.1)
Sodium: 141 mmol/L (ref 135–145)
Total Bilirubin: 0.9 mg/dL (ref 0.3–1.2)
Total Protein: 6.5 g/dL (ref 6.5–8.1)

## 2019-11-26 LAB — HEMOGLOBIN A1C
Hgb A1c MFr Bld: 5.7 % — ABNORMAL HIGH (ref 4.8–5.6)
Mean Plasma Glucose: 116.89 mg/dL

## 2019-11-26 LAB — I-STAT ARTERIAL BLOOD GAS, ED
Acid-Base Excess: 1 mmol/L (ref 0.0–2.0)
Bicarbonate: 26.7 mmol/L (ref 20.0–28.0)
Calcium, Ion: 1.3 mmol/L (ref 1.15–1.40)
HCT: 40 % (ref 39.0–52.0)
Hemoglobin: 13.6 g/dL (ref 13.0–17.0)
O2 Saturation: 86 %
Patient temperature: 100.1
Potassium: 3.7 mmol/L (ref 3.5–5.1)
Sodium: 145 mmol/L (ref 135–145)
TCO2: 28 mmol/L (ref 22–32)
pCO2 arterial: 45.7 mmHg (ref 32.0–48.0)
pH, Arterial: 7.378 (ref 7.350–7.450)
pO2, Arterial: 56 mmHg — ABNORMAL LOW (ref 83.0–108.0)

## 2019-11-26 LAB — CBC WITH DIFFERENTIAL/PLATELET
Abs Immature Granulocytes: 0.03 10*3/uL (ref 0.00–0.07)
Basophils Absolute: 0 10*3/uL (ref 0.0–0.1)
Basophils Relative: 0 %
Eosinophils Absolute: 0 10*3/uL (ref 0.0–0.5)
Eosinophils Relative: 0 %
HCT: 37.7 % — ABNORMAL LOW (ref 39.0–52.0)
Hemoglobin: 11.8 g/dL — ABNORMAL LOW (ref 13.0–17.0)
Immature Granulocytes: 0 %
Lymphocytes Relative: 5 %
Lymphs Abs: 0.4 10*3/uL — ABNORMAL LOW (ref 0.7–4.0)
MCH: 26.5 pg (ref 26.0–34.0)
MCHC: 31.3 g/dL (ref 30.0–36.0)
MCV: 84.5 fL (ref 80.0–100.0)
Monocytes Absolute: 0.5 10*3/uL (ref 0.1–1.0)
Monocytes Relative: 7 %
Neutro Abs: 6.6 10*3/uL (ref 1.7–7.7)
Neutrophils Relative %: 88 %
Platelets: 138 10*3/uL — ABNORMAL LOW (ref 150–400)
RBC: 4.46 MIL/uL (ref 4.22–5.81)
RDW: 12.5 % (ref 11.5–15.5)
WBC: 7.6 10*3/uL (ref 4.0–10.5)
nRBC: 0 % (ref 0.0–0.2)

## 2019-11-26 LAB — TYPE AND SCREEN
ABO/RH(D): O POS
Antibody Screen: NEGATIVE

## 2019-11-26 LAB — LACTIC ACID, PLASMA
Lactic Acid, Venous: 1.7 mmol/L (ref 0.5–1.9)
Lactic Acid, Venous: 3.3 mmol/L (ref 0.5–1.9)
Lactic Acid, Venous: 1.7 mmol/L (ref 0.5–1.9)
Lactic Acid, Venous: 1.8 mmol/L (ref 0.5–1.9)

## 2019-11-26 LAB — PROTIME-INR
INR: 1.2 (ref 0.8–1.2)
Prothrombin Time: 14.4 seconds (ref 11.4–15.2)

## 2019-11-26 LAB — GLUCOSE, CAPILLARY
Glucose-Capillary: 101 mg/dL — ABNORMAL HIGH (ref 70–99)
Glucose-Capillary: 116 mg/dL — ABNORMAL HIGH (ref 70–99)
Glucose-Capillary: 93 mg/dL (ref 70–99)

## 2019-11-26 LAB — ABO/RH: ABO/RH(D): O POS

## 2019-11-26 LAB — APTT: aPTT: 29 seconds (ref 24–36)

## 2019-11-26 LAB — PROCALCITONIN: Procalcitonin: 6.79 ng/mL

## 2019-11-26 LAB — SARS CORONAVIRUS 2 BY RT PCR (HOSPITAL ORDER, PERFORMED IN ~~LOC~~ HOSPITAL LAB): SARS Coronavirus 2: NEGATIVE

## 2019-11-26 MED ORDER — LEVETIRACETAM IN NACL 1000 MG/100ML IV SOLN
1000.0000 mg | INTRAVENOUS | Status: DC
  Administered 2019-11-26 – 2019-11-27 (×2): 1000 mg via INTRAVENOUS
  Filled 2019-11-26 (×3): qty 100

## 2019-11-26 MED ORDER — PIPERACILLIN-TAZOBACTAM 3.375 G IVPB
3.3750 g | Freq: Three times a day (TID) | INTRAVENOUS | Status: DC
  Administered 2019-11-26 – 2019-11-29 (×10): 3.375 g via INTRAVENOUS
  Filled 2019-11-26 (×9): qty 50

## 2019-11-26 MED ORDER — IOHEXOL 300 MG/ML  SOLN: 500.0000 mL | INTRAMUSCULAR | Status: AC

## 2019-11-26 MED ORDER — LEVALBUTEROL HCL 0.63 MG/3ML IN NEBU
0.6300 mg | INHALATION_SOLUTION | Freq: Four times a day (QID) | RESPIRATORY_TRACT | Status: DC
  Administered 2019-11-26 (×2): 0.63 mg via RESPIRATORY_TRACT
  Filled 2019-11-26 (×2): qty 3

## 2019-11-26 MED ORDER — SODIUM CHLORIDE 0.9 % IV BOLUS
1000.0000 mL | Freq: Once | INTRAVENOUS | Status: AC
  Administered 2019-11-26: 1000 mL via INTRAVENOUS

## 2019-11-26 MED ORDER — ONDANSETRON HCL 4 MG/2ML IJ SOLN: 4.0000 mg | Freq: Four times a day (QID) | INTRAMUSCULAR | Status: DC | PRN

## 2019-11-26 MED ORDER — OLANZAPINE 10 MG IM SOLR
5.0000 mg | Freq: Once | INTRAMUSCULAR | Status: AC
  Administered 2019-11-26: 5 mg via INTRAMUSCULAR
  Filled 2019-11-26 (×2): qty 10

## 2019-11-26 MED ORDER — LABETALOL HCL 5 MG/ML IV SOLN: 10.0000 mg | INTRAVENOUS | Status: DC | PRN

## 2019-11-26 MED ORDER — LEVALBUTEROL HCL 0.63 MG/3ML IN NEBU: 0.6300 mg | INHALATION_SOLUTION | Freq: Four times a day (QID) | RESPIRATORY_TRACT | Status: DC | PRN

## 2019-11-26 MED ORDER — IOHEXOL 9 MG/ML PO SOLN
ORAL | Status: AC
  Filled 2019-11-26: qty 1000

## 2019-11-26 MED ORDER — ACETAMINOPHEN 650 MG RE SUPP
650.0000 mg | Freq: Four times a day (QID) | RECTAL | Status: DC | PRN
  Administered 2019-11-26 (×2): 650 mg via RECTAL
  Filled 2019-11-26 (×2): qty 1

## 2019-11-26 MED ORDER — ONDANSETRON HCL 4 MG PO TABS: 4.0000 mg | ORAL_TABLET | Freq: Four times a day (QID) | ORAL | Status: DC | PRN

## 2019-11-26 MED ORDER — VANCOMYCIN HCL 750 MG/150ML IV SOLN
750.0000 mg | INTRAVENOUS | Status: DC
  Administered 2019-11-27 – 2019-11-28 (×2): 750 mg via INTRAVENOUS
  Filled 2019-11-26 (×2): qty 150

## 2019-11-26 MED ORDER — VANCOMYCIN HCL IN DEXTROSE 1-5 GM/200ML-% IV SOLN
1000.0000 mg | Freq: Once | INTRAVENOUS | Status: AC
  Administered 2019-11-26: 1000 mg via INTRAVENOUS
  Filled 2019-11-26: qty 200

## 2019-11-26 MED ORDER — ENOXAPARIN SODIUM 30 MG/0.3ML ~~LOC~~ SOLN
30.0000 mg | SUBCUTANEOUS | Status: DC
  Administered 2019-11-26 – 2019-11-27 (×2): 30 mg via SUBCUTANEOUS
  Filled 2019-11-26 (×2): qty 0.3

## 2019-11-26 MED ORDER — POTASSIUM CHLORIDE 10 MEQ/100ML IV SOLN
10.0000 meq | Freq: Once | INTRAVENOUS | Status: AC
  Administered 2019-11-26: 10 meq via INTRAVENOUS
  Filled 2019-11-26: qty 100

## 2019-11-26 MED ORDER — DEXTROSE-NACL 5-0.9 % IV SOLN: INTRAVENOUS | Status: AC

## 2019-11-26 NOTE — Progress Notes (Signed)
Pharmacy Antibiotic Note  Hunter Chavez is a 69 y.o. male admitted on 11/25/2019 with PNA.  Pharmacy has been consulted for Vancomycin  dosing.  Plan: Vancomycin 1000 mg IV now, then 750 mg IV q24h  Weight: 47.9 kg (105 lb 9.6 oz)  Temp (24hrs), Avg:100.6 F (38.1 C), Min:97.6 F (36.4 C), Max:104.1 F (40.1 C)  Recent Labs  Lab 11/25/19 2041 11/25/19 2236 11/26/19 0036  WBC 13.0*  --   --   CREATININE 1.49*  --   --   LATICACIDVEN  --  2.3* 1.7    CrCl cannot be calculated (Unknown ideal weight.).    No Known Allergies  Eddie Candle 11/26/2019 2:10 AM

## 2019-11-26 NOTE — Significant Event (Signed)
HOSPITAL MEDICINE OVERNIGHT EVENT NOTE  Notified by nursing of patient's fever of 101.4.   Chart reviewed, patient is on appropriate IV Abx therapy, is receiving IVF.  O2 requirements are slowly decreasing.  I have advised to continue the current plan of care, treat fever with PRN antipyretic.    Hunter Chavez

## 2019-11-26 NOTE — H&P (Signed)
History and Physical    Hunter Chavez ENI:[id number] DOB: Apr 20, 1951 DOA: 11/25/2019  PCP: Angelica Chessman, MD  Patient coming from: Home.  History obtained from patient's sister.  Chief Complaint: Choking episode.  HPI: Hunter Chavez is a 69 y.o. male with history of seizure disorder, intellectual disability, parkinsonism, hypertension had a choking episode last evening at home when patient tried to eat his food.  Patient was being helped by patient's sister for eating when patient took one of the bites without his sister's knowledge and choked on it.  Following which patient had 2 episodes of vomiting.  Patient was in respiratory distress and was taken to the ER at [facility].  As per the sister patient has been having frequent choking episodes recently.  ED Course: In the ER patient was hypoxic and required high flow oxygen 15 L to maintain sats.  Chest x-ray showed bilateral infiltrates.  Chest x-ray also showed features concerning for possible ileus.  KUB was done which shows features concerning for same.  Patient's initial ABG was 7.30 with a PCO2 of 54 which improved to 1.37 with PCO2 of 54.  Labs are remarkable for sodium of 141 creatinine 1.4 lactic acid of 2.3 which improved to 1.7 again worsened one 3.3.  For which a fluid bolus was ordered.  Blood sugars of 197.  WBC count 13.  Patient became febrile with a temperature of 104 F for which patient was started on antibiotics after blood cultures obtained.  At the time of my exam patient is not very communicative and as per the patient's sister patient is usually noncommunicative.  Review of Systems: As per HPI, rest all negative.   Past Medical History:  Diagnosis Date  . Anxiety   . Dementia (HCC)   . Hypertension   . Learning disorder   . Seizures (HCC)     Past Surgical History:  Procedure Laterality Date  . HIP SURGERY       reports that he has never smoked. He has never used smokeless tobacco. He  reports that he does not drink alcohol and does not use drugs.  No Known Allergies  Family History  Problem Relation Age of Onset  . Hypertension Mother   . Diabetes Mellitus II Mother   . Hypertension Sister   . Hypertension Brother     Prior to Admission medications   Medication Sig Start Date End Date Taking? Authorizing Provider  diltiazem (CARDIZEM) 120 MG tablet Take 120 mg by mouth 4 (four) times daily.    [provider]  donepezil (ARICEPT) 10 MG tablet Take 10 mg by mouth at bedtime.    [provider]  famotidine (PEPCID) 10 MG tablet Take 10 mg by mouth 2 (two) times daily.    [provider]  levETIRAcetam (KEPPRA) 500 MG tablet Take 500 mg by mouth 2 (two) times daily.    [provider]  LORazepam (ATIVAN) 0.5 MG tablet Take 0.5 mg by mouth every 8 (eight) hours.    [provider]  pravastatin (PRAVACHOL) 20 MG tablet Take 20 mg by mouth daily.    [provider]    Physical Exam: Constitutional: Moderately built and nourished. Vitals:   11/26/19 0030 11/26/19 0034 11/26/19 0054 11/26/19 0206  BP: (!) 152/74   (!) 152/70  Pulse: 105  104 (!) 117  Resp: (!) 51  (!) 38 (!) 47  Temp:  100.1 F (37.8 C)  (!) 104.1 F (40.1 C)  TempSrc:  Oral  Rectal  SpO2: 99%  100% 98%  Weight:    47.9 kg   Eyes: Anicteric no pallor. ENMT: No discharge from the ears eyes nose or mouth. Neck: No mass felt.  No neck rigidity. Respiratory: No rhonchi or crepitations. Cardiovascular: S1-S2 heard. Abdomen: Soft nontender bowel sounds present. Musculoskeletal: No edema. Skin: No rash. Neurologic: Alert awake but noncommunicative.  Does not follow commands. Psychiatric: Patient is nonverbal and not following commands.   Labs on Admission: I have personally reviewed following labs and imaging studies  CBC: Recent Labs  Lab 11/25/19 2041 11/25/19 2250 11/26/19 0027  WBC 13.0*  --   --   NEUTROABS 11.3*  --   --   HGB  13.7 13.6 13.6  HCT 44.3 40.0 40.0  MCV 86.4  --   --   PLT 160  --   --    Basic Metabolic Panel: Recent Labs  Lab 11/25/19 2041 11/25/19 2250 11/26/19 0027  NA 141 143 145  K 3.9 3.7 3.7  CL 100  --   --   CO2 27  --   --   GLUCOSE 197*  --   --   BUN 18  --   --   CREATININE 1.49*  --   --   CALCIUM 9.6  --   --    GFR: CrCl cannot be calculated (Unknown ideal weight.). Liver Function Tests: No results for input(s): AST, ALT, ALKPHOS, BILITOT, PROT, ALBUMIN in the last 168 hours. No results for input(s): LIPASE, AMYLASE in the last 168 hours. No results for input(s): AMMONIA in the last 168 hours. Coagulation Profile: No results for input(s): INR, PROTIME in the last 168 hours. Cardiac Enzymes: No results for input(s): CKTOTAL, CKMB, CKMBINDEX, TROPONINI in the last 168 hours. BNP (last 3 results) No results for input(s): PROBNP in the last 8760 hours. HbA1C: No results for input(s): HGBA1C in the last 72 hours. CBG: No results for input(s): GLUCAP in the last 168 hours. Lipid Profile: No results for input(s): CHOL, HDL, LDLCALC, TRIG, CHOLHDL, LDLDIRECT in the last 72 hours. Thyroid Function Tests: No results for input(s): TSH, T4TOTAL, FREET4, T3FREE, THYROIDAB in the last 72 hours. Anemia Panel: No results for input(s): VITAMINB12, FOLATE, FERRITIN, TIBC, IRON, RETICCTPCT in the last 72 hours. Urine analysis: No results found for: COLORURINE, APPEARANCEUR, LABSPEC, PHURINE, GLUCOSEU, HGBUR, BILIRUBINUR, KETONESUR, PROTEINUR, UROBILINOGEN, NITRITE, LEUKOCYTESUR Sepsis Labs: @LABRCNTIP (procalcitonin:4,lacticidven:4) ) Recent Results (from the past 240 hour(s))  SARS Coronavirus 2 by RT PCR (hospital order, performed in [facility] hospital lab) Nasopharyngeal Peripheral     Status: None   Collection Time: 11/25/19 10:37 PM   Specimen: Peripheral; Nasopharyngeal  Result Value Ref Range Status   SARS Coronavirus 2 NEGATIVE NEGATIVE Final    Comment:  (NOTE) SARS-CoV-2 target nucleic acids are NOT DETECTED.  The SARS-CoV-2 RNA is generally detectable in upper and lower respiratory specimens during the acute phase of infection. The lowest concentration of SARS-CoV-2 viral copies this assay can detect is 250 copies / mL. A negative result does not preclude SARS-CoV-2 infection and should not be used as the sole basis for treatment or other patient management decisions.  A negative result may occur with improper specimen collection / handling, submission of specimen other than nasopharyngeal swab, presence of viral mutation(s) within the areas targeted by this assay, and inadequate number of viral copies (<250 copies / mL). A negative result must be combined with clinical observations, patient history, and epidemiological information.  Fact  Sheet for Patients:   BoilerBrush.com.cyhttps://www.fda.gov/media/136312/download  Fact Sheet for Healthcare Providers: https://pope.com/https://www.fda.gov/media/136313/download  This test is not yet approved or  cleared by the [place] FDA and has been authorized for detection and/or diagnosis of SARS-CoV-2 by FDA under an Emergency Use Authorization (EUA).  This EUA will remain in effect (meaning this test can be used) for the duration of the COVID-19 declaration under Section 564(b)(1) of the Act, 21 U.S.C. section 360bbb-3(b)(1), unless the authorization is terminated or revoked sooner.  Performed at [facility] [place], [address] [place], [place] [address]      Radiological Exams on Admission: [facility]  Result Date: 11/25/2019 CLINICAL DATA:  Possibly choked on hot dog EXAM: PORTABLE CHEST 1 VIEW COMPARISON:  CT 08/11/2018, chest x-ray 11/27/2015 FINDINGS: Bilateral lower lobe airspace disease. Mild cardiomegaly. No pleural effusion or pneumothorax. Lucency beneath both diaphragms. IMPRESSION: 1. Bilateral right greater than left lower lobe airspace disease which may reflect pneumonia or  aspiration. Cardiomegaly 2. Lucency beneath both diaphragms suspected to represent marked gaseous dilatation of bowel and stomach, though recommend dedicated abdominal radiographs to exclude free air. Critical Value/emergent results were called by telephone at the time of interpretation on 11/25/2019 at 9:28 pm to provider DAVID YAO , who verbally acknowledged these results. Electronically Signed   By: Jasmine PangKim  Fujinaga M.D.   On: 11/25/2019 21:28   DG Abd Portable 2 Views  Result Date: 11/25/2019 CLINICAL DATA:  Possible free air under the diaphragm on chest x-ray. Abdominal distension. Aspiration. EXAM: PORTABLE ABDOMEN - 2 VIEW COMPARISON:  Chest radiograph earlier this day. FINDINGS: Supine and upright views of the abdomen obtained. Lucency under the left hemidiaphragm represents gaseous distention of stomach with air-fluid level. Lucency under the right hemidiaphragm represent gaseous distention of colon. There is mild generalized diffuse gaseous distention of both large and small bowel throughout the abdomen. No evidence of free intra-abdominal air. No formed stool. Calcifications in the pelvis are typical of phleboliths. Surgical hardware in the right proximal femur is partially included. No acute osseous abnormalities are seen. IMPRESSION: No evidence of free intra-abdominal air. Air under the hemidiaphragms on chest radiograph represents gaseous distention of stomach and colon. There is generalized increased bowel gas throughout the stomach, small and large bowel suggesting slow transit/ileus. Electronically Signed   By: Narda RutherfordMelanie  Sanford M.D.   On: 11/25/2019 21:53     Assessment/Plan Principal Problem:   Acute respiratory failure with hypoxia (HCC) Active Problems:   Aspiration pneumonia of both lower lobes (HCC)   Parkinson's disease (HCC)   Dementia (HCC)   Essential hypertension    1. Acute respiratory failure with hypoxia secondary to aspiration possible developing sepsis for which patient  has been started on empiric antibiotics follow cultures continue hydration follow lactic acid level procalcitonin levels.  Swallow evaluation until then patient be n.p.o. 2. Possible ileus as seen in the x-ray.  I have ordered CT abdomen until then patient will be n.p.o. 3. Acute renal failure likely from vomiting patient will be receiving fluids follow metabolic panel. 4. Hypertension we will keep patient on as needed IV labetalol for now since patient is n.p.o.  Usually takes Cardizem. 5. History of seizure disorder on Keppra 1000 mg which has been ordered IV. 6. History of psychosis on Zyprexa which the neurologist is trying to slowly wean off.  Takes 10 mg p.o. at bedtime.  Recently decreased from twice daily.  I have discussed with pharmacy who will be dosing 1  dose tonight IM.  If patient fails swallow evaluation then need to contact pharmacy again to dose intramuscular.  Patient also takes Ativan 0.5 mg in the morning. 7. 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  Presently not on any medications.  Neurologist is trying to wean patient off Zyprexa. 8. Hyperglycemia check hemoglobin A1c.  Since patient has septic picture with acute respiratory failure will need close monitoring for any further deterioration in inpatient status.  I did discuss with on-call pulmonary critical care.  Patient's home medication has to be verified.  I did discuss with patient sister who had some of the medication list which I did order which was possible through IV.   DVT prophylaxis: Lovenox. Code Status: Full code as confirmed with patient's sister. Family Communication: Patient's sister. Disposition Plan: Home when stable. Consults called: Discussed with pulmonary critical care. Admission status: Inpatient.   Eduard Clos MD [facility] Pager [phone].  If 7PM-7AM, please contact night-coverage [email or web address] Password [facility]  11/26/2019, 3:35 AM

## 2019-11-26 NOTE — Plan of Care (Signed)
Poc progressing.  

## 2019-11-26 NOTE — Progress Notes (Signed)
Pt received from [facility] via [facility]. Chg bath completed, connected to tele and CCMD notified. Pt is non verbal, sister Hilda Lias is POA and in the room with pt. Call bell within reach of sister, oriented pt and sister to room and call bell system. Will continue to monitor.

## 2019-11-26 NOTE — Progress Notes (Addendum)
Same day note  Patient seen and examined at bedside.  Patient was admitted to the hospital for choking episodes, hypoxic  At the time of my evaluation, patient complains of none.  Patient is mildly communicative.  Following some commands.  Physical examination reveals male not in distress mildly verbal following simple commands.  Foley catheter in place.  Coarse breath sounds noted bilaterally.  Laboratory data and imaging was reviewed  Assessment and Plan.  Acute respiratory failure with hypoxia likely secondary to aspiration.  Patient is currently NPO.  Will get speech and swallow evaluation.  Continue antibiotics.  Lactic acid of 1.7.  Procalcitonin 6.7.  On high flow oxygen at 15 L initially.  Possible ileus as seen in the x-ray. Check CT abdomen pending  Acute kidney injury likely from volume depletion from vomiting.  Continue IV fluids.  Check BMP in a.m.   Essential Hypertension Usually takes Cardizem. On PRN labetalol.  History of seizure disorder on Keppra 1000 mg which has been ordered IV.  History of psychosis on Zyprexa.  Received IM dose.  If fails a swallow eval will need to continue IM doses.  Takes 10 mg at bedtime. Also takes Ativan 0.5 mg in the morning.  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  Presently not on any medications.  Neurologist is trying to wean patient off Zyprexa.  Hyperglycemia.  Likely reactive.  Hemoglobin A1c of 5.7.  Pressure Ulcer stage I coccyx. Prevention protocol to be adopted.  Pressure Injury 11/26/19 Coccyx  Stage 1 -  Intact skin with non-blanchable redness of a localized area usually over a bony prominence. (Active)  11/26/19 0200  Location: Coccyx  Location Orientation: -- (mid)  Staging: Stage 1 -  Intact skin with non-blanchable redness of a localized area usually over a bony prominence.  Wound Description (Comments):   Present on Admission: Yes       No charge.  Signed,  Tenny Craw, MD [facility]

## 2019-11-26 NOTE — Progress Notes (Signed)
Patient's ABG O2 saturation is 86%.  PCO2 and PH improved.  Increased patient's Hi Flo to 15 liters.  MD aware.  [facility] here to pick up patient.

## 2019-11-26 NOTE — Progress Notes (Signed)
SLP Cancellation Note  Patient Details Name: [name] MRN: [id number] DOB: 06/05/1950   Cancelled treatment:       Reason Eval/Treat Not Completed: Medical issues which prohibited therapy (Per RN, Pt is currently NPO secondary to possible ileus. SLP will follow up on subsequent date unless contacted sooner by staff.)  Yvone Neu I. Vear Clock, MS, CCC-SLP Acute Rehabilitation Services Office number [phone] Pager [phone]  Scheryl Marten 11/26/2019, 12:31 PM

## 2019-11-26 NOTE — ED Provider Notes (Signed)
12:01 AM 11/26/19 Dr. Leafy Half accepts for admission to stepdown at [facility].   Read Drivers, Jonny Ruiz, MD 11/26/19 0001

## 2019-11-26 NOTE — Progress Notes (Signed)
CRITICAL VALUE ALERT  Critical Value:  Lactic acid 3.3  Date & Time Notied:  11/26/19, 0881  Provider Notified: Dr. Stacey Drain  Orders Received/Actions taken: Normal saline bolus and lactic acid re ckeck.

## 2019-11-27 ENCOUNTER — Inpatient Hospital Stay (HOSPITAL_COMMUNITY): Payer: Medicare Other

## 2019-11-27 LAB — CBC
HCT: 33.7 % — ABNORMAL LOW (ref 39.0–52.0)
Hemoglobin: 10.5 g/dL — ABNORMAL LOW (ref 13.0–17.0)
MCH: 26.9 pg (ref 26.0–34.0)
MCHC: 31.2 g/dL (ref 30.0–36.0)
MCV: 86.2 fL (ref 80.0–100.0)
Platelets: 115 10*3/uL — ABNORMAL LOW (ref 150–400)
RBC: 3.91 MIL/uL — ABNORMAL LOW (ref 4.22–5.81)
RDW: 13 % (ref 11.5–15.5)
WBC: 11 10*3/uL — ABNORMAL HIGH (ref 4.0–10.5)
nRBC: 0 % (ref 0.0–0.2)

## 2019-11-27 LAB — COMPREHENSIVE METABOLIC PANEL
ALT: 19 U/L (ref 0–44)
AST: 20 U/L (ref 15–41)
Albumin: 2.7 g/dL — ABNORMAL LOW (ref 3.5–5.0)
Alkaline Phosphatase: 53 U/L (ref 38–126)
Anion gap: 6 (ref 5–15)
BUN: 7 mg/dL — ABNORMAL LOW (ref 8–23)
CO2: 28 mmol/L (ref 22–32)
Calcium: 8.6 mg/dL — ABNORMAL LOW (ref 8.9–10.3)
Chloride: 106 mmol/L (ref 98–111)
Creatinine, Ser: 1.08 mg/dL (ref 0.61–1.24)
GFR calc Af Amer: >60 mL/min (ref >60)
GFR calc non Af Amer: >60 mL/min (ref >60)
Glucose, Bld: 106 mg/dL — ABNORMAL HIGH (ref 70–99)
Potassium: 3.2 mmol/L — ABNORMAL LOW (ref 3.5–5.1)
Sodium: 140 mmol/L (ref 135–145)
Total Bilirubin: 0.6 mg/dL (ref 0.3–1.2)
Total Protein: 5.8 g/dL — ABNORMAL LOW (ref 6.5–8.1)

## 2019-11-27 LAB — GLUCOSE, CAPILLARY
Glucose-Capillary: 106 mg/dL — ABNORMAL HIGH (ref 70–99)
Glucose-Capillary: 151 mg/dL — ABNORMAL HIGH (ref 70–99)
Glucose-Capillary: 76 mg/dL (ref 70–99)

## 2019-11-27 LAB — MAGNESIUM: Magnesium: 1.9 mg/dL (ref 1.7–2.4)

## 2019-11-27 LAB — PHOSPHORUS: Phosphorus: 1.8 mg/dL — ABNORMAL LOW (ref 2.5–4.6)

## 2019-11-27 MED ORDER — MELATONIN 5 MG PO TABS: 5.0000 mg | ORAL_TABLET | Freq: Every evening | ORAL | Status: DC | PRN

## 2019-11-27 MED ORDER — OLANZAPINE 10 MG PO TABS
10.0000 mg | ORAL_TABLET | Freq: Every day | ORAL | Status: DC
  Administered 2019-11-27 – 2019-11-28 (×2): 10 mg via ORAL
  Filled 2019-11-27 (×3): qty 1

## 2019-11-27 MED ORDER — POTASSIUM PHOSPHATES 15 MMOLE/5ML IV SOLN
30.0000 mmol | Freq: Once | INTRAVENOUS | Status: AC
  Administered 2019-11-27: 30 mmol via INTRAVENOUS
  Filled 2019-11-27: qty 10

## 2019-11-27 MED ORDER — DORZOLAMIDE HCL-TIMOLOL MAL 2-0.5 % OP SOLN
1.0000 [drp] | Freq: Two times a day (BID) | OPHTHALMIC | Status: DC
  Administered 2019-11-27 – 2019-11-29 (×4): 1 [drp] via OPHTHALMIC
  Filled 2019-11-27: qty 10

## 2019-11-27 MED ORDER — DEXTROSE-NACL 5-0.9 % IV SOLN: INTRAVENOUS | Status: AC

## 2019-11-27 MED ORDER — DILTIAZEM HCL ER COATED BEADS 120 MG PO CP24
240.0000 mg | ORAL_CAPSULE | Freq: Every day | ORAL | Status: DC
  Administered 2019-11-27 – 2019-11-29 (×3): 240 mg via ORAL
  Filled 2019-11-27 (×3): qty 2

## 2019-11-27 MED ORDER — BRIMONIDINE TARTRATE 0.2 % OP SOLN
1.0000 [drp] | Freq: Three times a day (TID) | OPHTHALMIC | Status: DC
  Administered 2019-11-27 – 2019-11-29 (×5): 1 [drp] via OPHTHALMIC
  Filled 2019-11-27: qty 5

## 2019-11-27 MED ORDER — ENOXAPARIN SODIUM 40 MG/0.4ML ~~LOC~~ SOLN
40.0000 mg | SUBCUTANEOUS | Status: DC
  Administered 2019-11-28 – 2019-11-29 (×2): 40 mg via SUBCUTANEOUS
  Filled 2019-11-27 (×2): qty 0.4

## 2019-11-27 MED ORDER — LATANOPROST 0.005 % OP SOLN
1.0000 [drp] | Freq: Every day | OPHTHALMIC | Status: DC
  Administered 2019-11-27 – 2019-11-28 (×2): 1 [drp] via OPHTHALMIC
  Filled 2019-11-27: qty 2.5

## 2019-11-27 NOTE — Progress Notes (Signed)
Modified Barium Swallow Progress Note  Patient Details  Name: Hunter Chavez MRN: [id number] Date of Birth: [birth year]  Today's Date: 11/27/2019  Modified Barium Swallow completed.  Full report located under Chart Review in the Imaging Section.  Brief recommendations include the following:  Clinical Impression  Pt presented with significant cervical kyphosis which increased the size of the laryngopharynx. His swallow mechanism was within functional limits with intermittent piecemeal deglutition of larger boluses. No instance of aspiration were demonstrated with solids or liquids. Pt's sister reported that the pt demonstrates a significantly increased intake rate and therefore needs full supervision and his meats cut to at most dime-sized pieces. A dysphagia 2 diet with thin liquids is recommended at this time with observance of swallowing precautions and full supervision during meals. SLP will follow to ensure diet tolerance.   Swallow Evaluation Recommendations       SLP Diet Recommendations: Dysphagia 2 (Fine chop) solids;Thin liquid   Liquid Administration via: Cup;Straw   Medication Administration: Crushed with puree   Supervision: Full assist for feeding;Staff to assist with self feeding   Compensations: Slow rate;Small sips/bites   Postural Changes: Remain semi-upright after after feeds/meals (Comment);Seated upright at 90 degrees   Oral Care Recommendations: Oral care BID       Hunter Chavez I. Vear Clock, MS, CCC-SLP Acute Rehabilitation Services Office number [phone] Pager [phone]  Hunter Chavez 11/27/2019,2:37 PM

## 2019-11-27 NOTE — Evaluation (Signed)
Clinical/Bedside Swallow Evaluation Patient Details  Name: Hunter Chavez MRN: [id number] Date of Birth: [birth year]  Today's Date: 11/27/2019 Time: SLP Start Time (ACUTE ONLY): 1045 SLP Stop Time (ACUTE ONLY): 1108 SLP Time Calculation (min) (ACUTE ONLY): 23 min  Past Medical History:  Past Medical History:  Diagnosis Date  . Anxiety   . Dementia (HCC)   . Hypertension   . Learning disorder   . Seizures (HCC)    Past Surgical History:  Past Surgical History:  Procedure Laterality Date  . HIP SURGERY     HPI:  Pt  is a 69 y.o. male history of seizure disorder, intellectual disability, parkinsonism, hypertension. Patient was feeding himself a hot dog and apparently did not chew his food and may have aspirated. Abdominal x-ray: There is generalized increased bowel gas throughout the stomach, small and large bowel suggesting slow transit/ileus. CXR 7/28: Bilateral right greater than left lower lobe airspace disease which may reflect pneumonia or aspiration. CT abdomen: Confluent bilateral perihilar airspace opacity in the visible lungs. Cardiomegaly but no pleural fluid. Differential considerations include aspiration, bilateral pneumonia, less likely edema. no acute or inflammatory process identified. Redundant gas-filled large bowel is similar to a 2020 CT and chronic colonic dysfunction is favored.   Assessment / Plan / Recommendation Clinical Impression  Pt was seen for bedside swallow evaluation with his sister present. Oral mechanism exam was limited due to pt's difficulty following commands but he presented with reduced dentition. He presented with symptoms of oropharyngeal dysphagia characterized by reduced labial stripping and signs of aspiration with puree solids and thin liquids. Pt's sister reported that the pt has been demonstrating coughing since the episode with the hotdog so it may be unrelated to swallowing. However, a modified barium swallow study is recommended to further  assess swallow function and is currently scheduled for today at 1330. Diet recommendations will be provided following the study.  SLP Visit Diagnosis: Dysphagia, pharyngeal phase (R13.13)    Aspiration Risk  Moderate aspiration risk    Diet Recommendation NPO   Medication Administration: Via alternative means    Other  Recommendations Oral Care Recommendations: Oral care BID;Staff/trained caregiver to provide oral care   Follow up Recommendations None      Frequency and Duration min 2x/week  1 week       Prognosis Prognosis for Safe Diet Advancement: Fair Barriers to Reach Goals: Time post onset      Swallow Study   General Date of Onset: 11/25/19 HPI: Pt  is a 69 y.o. male history of seizure disorder, intellectual disability, parkinsonism, hypertension. Patient was feeding himself a hot dog and apparently did not chew his food and may have aspirated. Abdominal x-ray: There is generalized increased bowel gas throughout the stomach, small and large bowel suggesting slow transit/ileus. CXR 7/28: Bilateral right greater than left lower lobe airspace disease which may reflect pneumonia or aspiration. CT abdomen: Confluent bilateral perihilar airspace opacity in the visible lungs. Cardiomegaly but no pleural fluid. Differential considerations include aspiration, bilateral pneumonia, less likely edema. no acute or inflammatory process identified. Redundant gas-filled large bowel is similar to a 2020 CT and chronic colonic dysfunction is favored. Type of Study: Bedside Swallow Evaluation Previous Swallow Assessment: Pt's sister reported that the pt had a swallow study at [facility] a few years ago.  Diet Prior to this Study: NPO Temperature Spikes Noted: No Respiratory Status: Nasal cannula History of Recent Intubation: No Behavior/Cognition: Alert;Cooperative;Pleasant mood Oral Cavity Assessment: Within Functional Limits Oral Care Completed by  SLP: No Oral Cavity - Dentition: Missing  dentition Vision: Functional for self-feeding Self-Feeding Abilities: Needs assist Patient Positioning: Upright in bed;Postural control adequate for testing Baseline Vocal Quality: Not observed Volitional Cough: Cognitively unable to elicit Volitional Swallow: Able to elicit    Oral/Motor/Sensory Function Overall Oral Motor/Sensory Function: Other (comment) (Unable to assess )   Ice Chips Ice chips: Not tested   Thin Liquid Thin Liquid: Impaired Presentation: Straw;Spoon Pharyngeal  Phase Impairments: Cough - Immediate    Nectar Thick Nectar Thick Liquid: Not tested   Honey Thick Honey Thick Liquid: Not tested   Puree Puree: Impaired Presentation: Spoon Pharyngeal Phase Impairments: Cough - Immediate   Solid     Solid: Not tested     Hunter Chavez I. Vear Clock, MS, CCC-SLP Acute Rehabilitation Services Office number [phone] Pager [phone]  Scheryl Marten 11/27/2019,11:42 AM

## 2019-11-27 NOTE — Progress Notes (Addendum)
PROGRESS NOTE  Hunter Chavez BWL:[id number] DOB: 12/25/1950 DOA: 11/25/2019 PCP: Angelica Chessman, MD   LOS: 1 day   Brief narrative: As per HPI,   Hunter Chavez is a 69 y.o. male with history of seizure disorder, intellectual disability, parkinsonism, hypertension had a choking episode  at home when patient tried to eat his food.  Patient was being helped by patient's sister for eating when patient took one of the bites without his sister's knowledge and choked on it.  Following which patient had 2 episodes of vomiting.  Patient was in respiratory distress and was taken to the ER at [facility].  As per the sister patient had  been having frequent choking episodes recently.  ED Course: In the ER, patient was hypoxic and required high flow oxygen 15 L to maintain sats.  Chest x-ray showed bilateral infiltrates.  Chest x-ray also showed features concerning for possible ileus.  KUB was done which shows features concerning for same.  Patient's initial ABG was 7.30 with a PCO2 of 54 which improved to 1.37 with PCO2 of 54.  Labs are remarkable for sodium of 141 creatinine 1.4 lactic acid of 2.3 which improved to 1.7 again worsened one 3.3.  For which a fluid bolus was ordered.  Blood sugars of 197.  WBC count 13.  Patient became febrile with a temperature of 104 F for which patient was started on antibiotics after blood cultures obtained.    Patient was then considered for admission to the hospital.  Assessment/Plan:  Principal Problem:   Acute respiratory failure with hypoxia (HCC) Active Problems:   Aspiration pneumonia of both lower lobes (HCC)   Parkinson's disease (HCC)   Dementia (HCC)   Essential hypertension   Pressure injury of skin   Acute respiratory failure with hypoxia likely secondary to aspiration.  Patient is currently NPO.  Speech therapy has seen the patient today and recommend n.p.o. and modified barium swallow examination.  Continue IV antibiotics.  Currently on  vancomycin and Zosyn.  Lactic acid of 1.7.  Procalcitonin 6.7.  On high flow oxygen at 15 L initially. Now on 4L [place]. Continue to monitor closely and wean as able. Temp max of 101F, WBC of 11.0.  Will de-escalate vancomycin by tomorrow if afebrile.    Possible ileus as seen in the x-ray. Check CT abdomen not a good study but no acute dilation.  Okay to advance her diet as tolerated.  Speech and swallow pending.  Hypophosphatemia, hypokalemia.  Phosphorus 1.8 potassium 3.2.  Will replenish with potassium phosphate.  Check levels in a.m.  Acute kidney injury likely from volume depletion from vomiting.  Continue IV fluids. Improved. Creatinine of 1.0 from 1.3  Essential Hypertension Usually takes Cardizem. On PRN labetalol.  Switch to oral when p.o. okay  History of seizure disorder on Keppra 1000 mg  IV.  History of psychosis on Zyprexa.  Received IM dose.  If fails a swallow eval will need to continue IM doses.  Takes 10 mg at bedtime. Also takes Ativan 0.5 mg in the morning.  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  Presently not on any medications.  Neurologist is trying to wean patient off Zyprexa.  Hyperglycemia.  Likely reactive.  Hemoglobin A1c of 5.7.  Pressure Ulcer stage I coccyx. Prevention protocol to be adopted.  Pressure Injury 11/26/19 Coccyx  Stage 1 -  Intact skin with non-blanchable redness of a localized area usually over a bony prominence. (Active)  11/26/19  0200  Location: Coccyx  Location Orientation: -- (mid)  Staging: Stage 1 -  Intact skin with non-blanchable redness of a localized area usually over a bony prominence.  Wound Description (Comments):   Present on Admission: Yes   DVT prophylaxis: enoxaparin (LOVENOX) injection 30 mg Start: 11/26/19 1000   Code Status: Full code  Family Communication: Spoke with patient's sister at bedside who is also the caretaker for the patient.  Status is: Inpatient  Remains inpatient  appropriate because:Unsafe d/c plan, IV treatments appropriate due to intensity of illness or inability to take PO, Inpatient level of care appropriate due to severity of illness and Acute hypoxic respiratory failure   Dispo: The patient is from: Home              Anticipated d/c is to: Home              Anticipated d/c date is: 2 days              Patient currently is not medically stable to d/c.   Consultants:  None  Procedures:  None  Antibiotics:  . Vancomycin and Zosyn.  Anti-infectives (From admission, onward)   Start     Dose/Rate Route Frequency Ordered Stop   11/27/19 0600  vancomycin (VANCOREADY) IVPB 750 mg/150 mL     Discontinue     750 mg 150 mL/hr over 60 Minutes Intravenous Every 24 hours 11/26/19 0213     11/26/19 0600  piperacillin-tazobactam (ZOSYN) IVPB 3.375 g     Discontinue     3.375 g 12.5 mL/hr over 240 Minutes Intravenous Every 8 hours 11/26/19 0215     11/26/19 0300  vancomycin (VANCOCIN) IVPB 1000 mg/200 mL premix        1,000 mg 200 mL/hr over 60 Minutes Intravenous  Once 11/26/19 0213 11/26/19 0412   11/25/19 2245  Ampicillin-Sulbactam (UNASYN) 3 g in sodium chloride 0.9 % 100 mL IVPB        3 g 200 mL/hr over 30 Minutes Intravenous  Once 11/25/19 2235 11/26/19 0033   11/25/19 2200  levofloxacin (LEVAQUIN) tablet 500 mg        500 mg Oral  Once 11/25/19 2156 11/25/19 2208     Subjective: Today, patient was seen and examined at bedside.  Patient is at baseline with regards to his mentation and following few commands.  Nonverbal.  Objective: Vitals:   11/27/19 0236 11/27/19 0418  BP:  (!) 134/63  Pulse: 65 76  Resp: 22 (!) 27  Temp:  100.3 F (37.9 C)  SpO2: 97% 95%    Intake/Output Summary (Last 24 hours) at 11/27/2019 0735 Last data filed at 11/27/2019 0610 Gross per 24 hour  Intake 250 ml  Output 750 ml  Net -500 ml   Filed Weights   11/25/19 2026 11/26/19 0206 11/26/19 0801  Weight: 47.8 kg 47.9 kg 47.9 kg   Body mass index  is 18.13 kg/m.   Physical Exam: GENERAL: Patient is at baseline mentation as per the sister, following few commands.  Nonverbal.  Not in respiratory distress. HENT: No scleral pallor or icterus. Pupils equally reactive to light. Oral mucosa is moist NECK: is supple, no gross swelling noted. CHEST: Diminished breath sounds bilaterally with coarse breath sounds, CVS: S1 and S2 heard, no murmur. Regular rate and rhythm.  ABDOMEN: Soft, non-tender, bowel sounds are present.  External urinary catheter in place. EXTREMITIES: No edema. CNS: Cranial nerves are intact.  Moving extremities.  Following few commands. SKIN:  warm and dry , stage I pressure ulceration coccyx  Data Review: I have personally reviewed the following laboratory data and studies,  CBC: Recent Labs  Lab 11/25/19 2041 11/25/19 2250 11/26/19 0027 11/26/19 0315 11/27/19 0445  WBC 13.0*  --   --  7.6 11.0*  NEUTROABS 11.3*  --   --  6.6  --   HGB 13.7 13.6 13.6 11.8* 10.5*  HCT 44.3 40.0 40.0 37.7* 33.7*  MCV 86.4  --   --  84.5 86.2  PLT 160  --   --  138* PENDING   Basic Metabolic Panel: Recent Labs  Lab 11/25/19 2041 11/25/19 2250 11/26/19 0027 11/26/19 0315 11/27/19 0445  NA 141 143 145 141 140  K 3.9 3.7 3.7 3.4* 3.2*  CL 100  --   --  107 106  CO2 27  --   --  24 28  GLUCOSE 197*  --   --  156* 106*  BUN 18  --   --  14 7*  CREATININE 1.49*  --   --  1.30* 1.08  CALCIUM 9.6  --   --  8.9 8.6*  MG  --   --   --   --  1.9  PHOS  --   --   --   --  1.8*   Liver Function Tests: Recent Labs  Lab 11/26/19 0315 11/27/19 0445  AST 28 20  ALT 28 19  ALKPHOS 69 53  BILITOT 0.9 0.6  PROT 6.5 5.8*  ALBUMIN 3.3* 2.7*   No results for input(s): LIPASE, AMYLASE in the last 168 hours. No results for input(s): AMMONIA in the last 168 hours. Cardiac Enzymes: No results for input(s): CKTOTAL, CKMB, CKMBINDEX, TROPONINI in the last 168 hours. BNP (last 3 results) No results for input(s): BNP in the last  8760 hours.  ProBNP (last 3 results) No results for input(s): PROBNP in the last 8760 hours.  CBG: Recent Labs  Lab 11/26/19 0611 11/26/19 1203 11/26/19 2344 11/27/19 0610  GLUCAP 116* 93 101* 106*   Recent Results (from the past 240 hour(s))  SARS Coronavirus 2 by RT PCR (hospital order, performed in [facility] hospital lab) Nasopharyngeal Peripheral     Status: None   Collection Time: 11/25/19 10:37 PM   Specimen: Peripheral; Nasopharyngeal  Result Value Ref Range Status   SARS Coronavirus 2 NEGATIVE NEGATIVE Final    Comment: (NOTE) SARS-CoV-2 target nucleic acids are NOT DETECTED.  The SARS-CoV-2 RNA is generally detectable in upper and lower respiratory specimens during the acute phase of infection. The lowest concentration of SARS-CoV-2 viral copies this assay can detect is 250 copies / mL. A negative result does not preclude SARS-CoV-2 infection and should not be used as the sole basis for treatment or other patient management decisions.  A negative result may occur with improper specimen collection / handling, submission of specimen other than nasopharyngeal swab, presence of viral mutation(s) within the areas targeted by this assay, and inadequate number of viral copies (<250 copies / mL). A negative result must be combined with clinical observations, patient history, and epidemiological information.  Fact Sheet for Patients:   BoilerBrush.com.cy  Fact Sheet for Healthcare Providers: https://pope.com/  This test is not yet approved or  cleared by the [place] FDA and has been authorized for detection and/or diagnosis of SARS-CoV-2 by FDA under an Emergency Use Authorization (EUA).  This EUA will remain in effect (meaning this test can be used) for the duration of  the COVID-19 declaration under Section 564(b)(1) of the Act, 21 U.S.C. section 360bbb-3(b)(1), unless the authorization is terminated or revoked  sooner.  Performed at [facility] [place], [address] [place], [place] [address]      Studies: CT ABDOMEN PELVIS WO CONTRAST  Result Date: 11/26/2019 CLINICAL DATA:  69 year old male with abdominal distension. Negative for COVID-19 yesterday. EXAM: CT ABDOMEN AND PELVIS WITHOUT CONTRAST TECHNIQUE: Multidetector CT imaging of the abdomen and pelvis was performed following the standard protocol without IV contrast. COMPARISON:  CT Abdomen and Pelvis 08/11/2018. FINDINGS: Lower chest: Confluent bilateral perihilar airspace opacity. Middle lobes relatively spared. Cardiomegaly appears stable since last year. No pericardial or pleural effusion. Hepatobiliary: Stable and benign appearing low-density 2 cm area in the left hepatic lobe. Motion artifact. The gallbladder is poorly delineated today. There is trace fluid in the right gutter just below the tip of the liver on series 3, image 45. Pancreas: Negative noncontrast appearance. Spleen: Negative. Adrenals/Urinary Tract: Noncontrast adrenal glands and kidneys appears stable since last year. Several small benign appearing renal cysts. Unremarkable urinary bladder. Chronic pelvic phleboliths. No nephrolithiasis. Stomach/Bowel: Retained stool in the rectum. But mostly gas and fluid in the more proximal large bowel. The sigmoid is redundant, gas-filled occupying much of the central abdomen. Decompressed junction of the descending and sigmoid colon, but similar gas-filled appearance of the transverse colon and multiple small bowel loops. The gas pattern is similar to the 2020 CT. No transition point identified. The stomach is largely decompressed. No free air identified. Vascular/Lymphatic: Aortic caliber within normal limits. Vascular patency is not evaluated in the absence of IV contrast. No lymphadenopathy. Reproductive: Negative. Other: No pelvic free fluid. Musculoskeletal: No acute osseous abnormality identified. Proximal right femur ORIF is new since  last year. IMPRESSION: 1. Confluent bilateral perihilar airspace opacity in the visible lungs. Cardiomegaly but no pleural fluid. Differential considerations include aspiration, bilateral pneumonia, less likely edema. 2. Evaluation of the abdomen and pelvis mildly limited due to motion and lack of contrast. 3. There is trace free fluid in the right gutter, nonspecific. But otherwise no acute or inflammatory process identified. Redundant gas-filled large bowel is similar to a 2020 CT and chronic colonic dysfunction is favored. Electronically Signed   By: Odessa FlemingH  Hall M.D.   On: 11/26/2019 17:27   DG Chest Port 1 View  Result Date: 11/25/2019 CLINICAL DATA:  Possibly choked on hot dog EXAM: PORTABLE CHEST 1 VIEW COMPARISON:  CT 08/11/2018, chest x-ray 11/27/2015 FINDINGS: Bilateral lower lobe airspace disease. Mild cardiomegaly. No pleural effusion or pneumothorax. Lucency beneath both diaphragms. IMPRESSION: 1. Bilateral right greater than left lower lobe airspace disease which may reflect pneumonia or aspiration. Cardiomegaly 2. Lucency beneath both diaphragms suspected to represent marked gaseous dilatation of bowel and stomach, though recommend dedicated abdominal radiographs to exclude free air. Critical Value/emergent results were called by telephone at the time of interpretation on 11/25/2019 at 9:28 pm to provider DAVID YAO , who verbally acknowledged these results. Electronically Signed   By: Jasmine PangKim  Fujinaga M.D.   On: 11/25/2019 21:28   DG Abd Portable 2 Views  Result Date: 11/25/2019 CLINICAL DATA:  Possible free air under the diaphragm on chest x-ray. Abdominal distension. Aspiration. EXAM: PORTABLE ABDOMEN - 2 VIEW COMPARISON:  Chest radiograph earlier this day. FINDINGS: Supine and upright views of the abdomen obtained. Lucency under the left hemidiaphragm represents gaseous distention of stomach with air-fluid level. Lucency under the right hemidiaphragm represent gaseous distention of colon. There is  mild generalized diffuse gaseous distention of both large and small bowel throughout the abdomen. No evidence of free intra-abdominal air. No formed stool. Calcifications in the pelvis are typical of phleboliths. Surgical hardware in the right proximal femur is partially included. No acute osseous abnormalities are seen. IMPRESSION: No evidence of free intra-abdominal air. Air under the hemidiaphragms on chest radiograph represents gaseous distention of stomach and colon. There is generalized increased bowel gas throughout the stomach, small and large bowel suggesting slow transit/ileus. Electronically Signed   By: Narda Rutherford M.D.   On: 11/25/2019 21:53     Joycelyn Das, MD  [facility] 11/27/2019

## 2019-11-28 LAB — BASIC METABOLIC PANEL
Anion gap: 7 (ref 5–15)
BUN: 5 mg/dL — ABNORMAL LOW (ref 8–23)
CO2: 28 mmol/L (ref 22–32)
Calcium: 8.9 mg/dL (ref 8.9–10.3)
Chloride: 106 mmol/L (ref 98–111)
Creatinine, Ser: 1.05 mg/dL (ref 0.61–1.24)
GFR calc Af Amer: >60 mL/min (ref >60)
GFR calc non Af Amer: >60 mL/min (ref >60)
Glucose, Bld: 88 mg/dL (ref 70–99)
Potassium: 3.5 mmol/L (ref 3.5–5.1)
Sodium: 141 mmol/L (ref 135–145)

## 2019-11-28 LAB — PHOSPHORUS: Phosphorus: 2.9 mg/dL (ref 2.5–4.6)

## 2019-11-28 LAB — CBC
HCT: 33.5 % — ABNORMAL LOW (ref 39.0–52.0)
Hemoglobin: 10.5 g/dL — ABNORMAL LOW (ref 13.0–17.0)
MCH: 26.7 pg (ref 26.0–34.0)
MCHC: 31.3 g/dL (ref 30.0–36.0)
MCV: 85.2 fL (ref 80.0–100.0)
Platelets: 122 10*3/uL — ABNORMAL LOW (ref 150–400)
RBC: 3.93 MIL/uL — ABNORMAL LOW (ref 4.22–5.81)
RDW: 12.7 % (ref 11.5–15.5)
WBC: 11.2 10*3/uL — ABNORMAL HIGH (ref 4.0–10.5)
nRBC: 0 % (ref 0.0–0.2)

## 2019-11-28 LAB — GLUCOSE, CAPILLARY
Glucose-Capillary: 95 mg/dL (ref 70–99)
Glucose-Capillary: 114 mg/dL — ABNORMAL HIGH (ref 70–99)

## 2019-11-28 LAB — MAGNESIUM: Magnesium: 1.8 mg/dL (ref 1.7–2.4)

## 2019-11-28 MED ORDER — LEVETIRACETAM ER 500 MG PO TB24
1000.0000 mg | ORAL_TABLET | Freq: Every day | ORAL | Status: DC
  Administered 2019-11-28: 1000 mg via ORAL
  Filled 2019-11-28 (×2): qty 2

## 2019-11-28 MED ORDER — POTASSIUM & SODIUM PHOSPHATES 280-160-250 MG PO PACK
1.0000 | PACK | Freq: Three times a day (TID) | ORAL | Status: DC
  Administered 2019-11-28 – 2019-11-29 (×5): 1 via ORAL
  Filled 2019-11-28 (×7): qty 1

## 2019-11-28 NOTE — Progress Notes (Addendum)
PROGRESS NOTE  Hunter Chavez NFA:[id number] DOB: 03-08-1951 DOA: 11/25/2019 PCP: Angelica Chessman, MD   LOS: 2 days   Brief narrative: As per HPI,   Hunter Chavez is a 69 y.o. male with history of seizure disorder, intellectual disability, parkinsonism, hypertension had a choking episode  at home when patient tried to eat his food.  Patient was being helped by patient's sister for eating when patient took one of the bites without his sister's knowledge and choked on it.  Following which patient had 2 episodes of vomiting.  Patient was in respiratory distress and was taken to the ER at [facility].  As per the sister patient had  been having frequent choking episodes recently. In the ED,  patient was hypoxic and required high flow oxygen 15 L to maintain sats.  Chest x-ray showed bilateral infiltrates.  Chest x-ray also showed features concerning for possible ileus.  KUB was done which shows features concerning for same.  Patient's initial ABG was 7.30 with a PCO2 of 54 which improved to 1.37 with PCO2 of 54.  Labs are remarkable for sodium of 141 creatinine 1.4 lactic acid of 2.3 which improved to 1.7 again worsened one 3.3.  For which a fluid bolus was ordered.  Blood sugars of 197.  WBC count 13.  Patient became febrile with a temperature of 104 F for which patient was started on antibiotics after blood cultures obtained.    Patient was then considered for admission to the hospital.  Assessment/Plan:  Principal Problem:   Acute respiratory failure with hypoxia (HCC) Active Problems:   Aspiration pneumonia of both lower lobes (HCC)   Parkinson's disease (HCC)   Dementia (HCC)   Essential hypertension   Pressure injury of skin   Acute respiratory failure with hypoxia likely secondary to aspiration.    Speech has evaluated the patient and recommend dysphagia 2 diet. Currently on vancomycin and Zosyn.  Lactic acid of 1.7.  Procalcitonin 6.7.  Patient has been weaned down on oxygen  and on 3L now. Temp max of 98.7 F., WBC at 11.2.  Will discontinue vancomycin today.  Possible ileus as seen in the x-ray.  CT with no changes from prior CT scan..  No clinical signs of obstruction.  Tolerating p.o. okay.  Continue dysphagia 2 diet.  Hypophosphatemia, hypokalemia.  Improved with replacement.  Check BMP in a.m..  Added potassium phosphate orally.  Acute kidney injury likely from volume depletion from vomiting.  Resolved.  Essential Hypertension resumed home Cardizem at this time.  Blood pressure is reasonably okay.  History of seizure disorder on Keppra 1000 mg  IV. Will change to po.  History of psychosis on Zyprexa.  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  Presently not on any medications.  Neurologist is trying to wean patient off Zyprexa.  Hyperglycemia.  Likely reactive.  Hemoglobin A1c of 5.7.  Pressure Ulcer stage I coccyx. Prevention protocol to be adopted.  Pressure Injury 11/26/19 Coccyx  Stage 1 -  Intact skin with non-blanchable redness of a localized area usually over a bony prominence. (Active)  11/26/19 0200  Location: Coccyx  Location Orientation: -- (mid)  Staging: Stage 1 -  Intact skin with non-blanchable redness of a localized area usually over a bony prominence.  Wound Description (Comments):   Present on Admission: Yes   DVT prophylaxis: enoxaparin (LOVENOX) injection 40 mg Start: 11/28/19 1000   Code Status: Full code  Family Communication:  I spoke with patient's sister at bedside who is also the caretaker for the patient.  Status is: Inpatient  Remains inpatient appropriate because:Unsafe d/c plan, IV treatments appropriate due to intensity of illness or inability to take PO, Inpatient level of care appropriate due to severity of illness and Acute hypoxic respiratory failure   Dispo: The patient is from: Home              Anticipated d/c is to: Home              Anticipated d/c  date is: Likely 11/29/2019 if patient continues to improve, wean oxygen as able              Patient currently is not medically stable to d/c.   Consultants:   None  Procedures:  None  Antibiotics:   Anti-infectives (From admission, onward)   Start     Dose/Rate Route Frequency Ordered Stop   11/27/19 0600  vancomycin (VANCOREADY) IVPB 750 mg/150 mL     Discontinue     750 mg 150 mL/hr over 60 Minutes Intravenous Every 24 hours 11/26/19 0213     11/26/19 0600  piperacillin-tazobactam (ZOSYN) IVPB 3.375 g     Discontinue     3.375 g 12.5 mL/hr over 240 Minutes Intravenous Every 8 hours 11/26/19 0215     11/26/19 0300  vancomycin (VANCOCIN) IVPB 1000 mg/200 mL premix        1,000 mg 200 mL/hr over 60 Minutes Intravenous  Once 11/26/19 0213 11/26/19 0412   11/25/19 2245  Ampicillin-Sulbactam (UNASYN) 3 g in sodium chloride 0.9 % 100 mL IVPB        3 g 200 mL/hr over 30 Minutes Intravenous  Once 11/25/19 2235 11/26/19 0033   11/25/19 2200  levofloxacin (LEVAQUIN) tablet 500 mg        500 mg Oral  Once 11/25/19 2156 11/25/19 2208     Subjective: Today, patient was seen and examined at bedside.  Patient's family at bedside.  As per family patient is at his baseline mentation.  No increasing dyspnea or shortness of breath reported.  Starting to eat better.  Objective: Vitals:   11/28/19 0418 11/28/19 0732  BP: (!) 131/51 (!) 149/55  Pulse: 58 66  Resp: 20 19  Temp: 98.7 F (37.1 C) 98.7 F (37.1 C)  SpO2: 93% 96%    Intake/Output Summary (Last 24 hours) at 11/28/2019 0833 Last data filed at 11/28/2019 0504 Gross per 24 hour  Intake 120 ml  Output 2000 ml  Net -1880 ml   Filed Weights   11/25/19 2026 11/26/19 0206 11/26/19 0801  Weight: 47.8 kg 47.9 kg 47.9 kg   Body mass index is 18.13 kg/m.   Physical Exam: GENERAL: Patient is at baseline mentation as per the sister, following few commands.  Nonverbal.  Not in respiratory distress. HENT: No scleral pallor or  icterus. Pupils equally reactive to light. Oral mucosa is moist NECK: is supple, no gross swelling noted. CHEST: Diminished breath sounds bilaterally  CVS: S1 and S2 heard, no murmur. Regular rate and rhythm.  ABDOMEN: Soft, non-tender, bowel sounds are present.  External urinary catheter in place. EXTREMITIES: No edema. CNS: Cranial nerves are intact.  Moving extremities.  Following few commands. SKIN: warm and dry , stage I pressure ulceration coccyx  Data Review: I have personally reviewed the following laboratory data and studies,  CBC: Recent Labs  Lab 11/25/19 2041 11/25/19 2041 11/25/19 2250 11/26/19 0027 11/26/19 0315 11/27/19 0445  11/28/19 0215  WBC 13.0*  --   --   --  7.6 11.0* 11.2*  NEUTROABS 11.3*  --   --   --  6.6  --   --   HGB 13.7   < > 13.6 13.6 11.8* 10.5* 10.5*  HCT 44.3   < > 40.0 40.0 37.7* 33.7* 33.5*  MCV 86.4  --   --   --  84.5 86.2 85.2  PLT 160  --   --   --  138* 115* 122*   < > = values in this interval not displayed.   Basic Metabolic Panel: Recent Labs  Lab 11/25/19 2041 11/25/19 2041 11/25/19 2250 11/26/19 0027 11/26/19 0315 11/27/19 0445 11/28/19 0215  NA 141   < > 143 145 141 140 141  K 3.9   < > 3.7 3.7 3.4* 3.2* 3.5  CL 100  --   --   --  107 106 106  CO2 27  --   --   --  24 28 28   GLUCOSE 197*  --   --   --  156* 106* 88  BUN 18  --   --   --  14 7* 5*  CREATININE 1.49*  --   --   --  1.30* 1.08 1.05  CALCIUM 9.6  --   --   --  8.9 8.6* 8.9  MG  --   --   --   --   --  1.9 1.8  PHOS  --   --   --   --   --  1.8* 2.9   < > = values in this interval not displayed.   Liver Function Tests: Recent Labs  Lab 11/26/19 0315 11/27/19 0445  AST 28 20  ALT 28 19  ALKPHOS 69 53  BILITOT 0.9 0.6  PROT 6.5 5.8*  ALBUMIN 3.3* 2.7*   No results for input(s): LIPASE, AMYLASE in the last 168 hours. No results for input(s): AMMONIA in the last 168 hours. Cardiac Enzymes: No results for input(s): CKTOTAL, CKMB, CKMBINDEX,  TROPONINI in the last 168 hours. BNP (last 3 results) No results for input(s): BNP in the last 8760 hours.  ProBNP (last 3 results) No results for input(s): PROBNP in the last 8760 hours.  CBG: Recent Labs  Lab 11/26/19 2344 11/27/19 0610 11/27/19 1156 11/27/19 1643 11/28/19 0625  GLUCAP 101* 106* 151* 76 95   Recent Results (from the past 240 hour(s))  SARS Coronavirus 2 by RT PCR (hospital order, performed in [facility] hospital lab) Nasopharyngeal Peripheral     Status: None   Collection Time: 11/25/19 10:37 PM   Specimen: Peripheral; Nasopharyngeal  Result Value Ref Range Status   SARS Coronavirus 2 NEGATIVE NEGATIVE Final    Comment: (NOTE) SARS-CoV-2 target nucleic acids are NOT DETECTED.  The SARS-CoV-2 RNA is generally detectable in upper and lower respiratory specimens during the acute phase of infection. The lowest concentration of SARS-CoV-2 viral copies this assay can detect is 250 copies / mL. A negative result does not preclude SARS-CoV-2 infection and should not be used as the sole basis for treatment or other patient management decisions.  A negative result may occur with improper specimen collection / handling, submission of specimen other than nasopharyngeal swab, presence of viral mutation(s) within the areas targeted by this assay, and inadequate number of viral copies (<250 copies / mL). A negative result must be combined with clinical observations, patient history, and epidemiological information.  Fact  Sheet for Patients:   BoilerBrush.com.cy  Fact Sheet for Healthcare Providers: https://pope.com/  This test is not yet approved or  cleared by the [place] FDA and has been authorized for detection and/or diagnosis of SARS-CoV-2 by FDA under an Emergency Use Authorization (EUA).  This EUA will remain in effect (meaning this test can be used) for the duration of the COVID-19 declaration under  Section 564(b)(1) of the Act, 21 U.S.C. section 360bbb-3(b)(1), unless the authorization is terminated or revoked sooner.  Performed at [facility], [address] [place], [place] [address]   Blood culture (routine x 2)     Status: None (Preliminary result)   Collection Time: 11/25/19 11:00 PM   Specimen: BLOOD  Result Value Ref Range Status   Specimen Description   Final    BLOOD RIGHT ANTECUBITAL Performed at [facility], [address] [place], [place] [address]    Special Requests   Final    BOTTLES DRAWN AEROBIC AND ANAEROBIC Blood Culture adequate volume Performed at [facility], [address] [place], [place] [address]    Culture   Final    NO GROWTH 1 DAY Performed at [facility], [address] [place], [place] [address]    Report Status PENDING  Incomplete     Studies: CT ABDOMEN PELVIS WO CONTRAST  Result Date: 11/26/2019 CLINICAL DATA:  69 year old male with abdominal distension. Negative for COVID-19 yesterday. EXAM: CT ABDOMEN AND PELVIS WITHOUT CONTRAST TECHNIQUE: Multidetector CT imaging of the abdomen and pelvis was performed following the standard protocol without IV contrast. COMPARISON:  CT Abdomen and Pelvis 08/11/2018. FINDINGS: Lower chest: Confluent bilateral perihilar airspace opacity. Middle lobes relatively spared. Cardiomegaly appears stable since last year. No pericardial or pleural effusion. Hepatobiliary: Stable and benign appearing low-density 2 cm area in the left hepatic lobe. Motion artifact. The gallbladder is poorly delineated today. There is trace fluid in the right gutter just below the tip of the liver on series 3, image 45. Pancreas: Negative noncontrast appearance. Spleen: Negative. Adrenals/Urinary Tract: Noncontrast adrenal glands and kidneys appears stable since last year. Several small benign appearing renal cysts. Unremarkable urinary bladder. Chronic pelvic phleboliths. No nephrolithiasis.  Stomach/Bowel: Retained stool in the rectum. But mostly gas and fluid in the more proximal large bowel. The sigmoid is redundant, gas-filled occupying much of the central abdomen. Decompressed junction of the descending and sigmoid colon, but similar gas-filled appearance of the transverse colon and multiple small bowel loops. The gas pattern is similar to the 2020 CT. No transition point identified. The stomach is largely decompressed. No free air identified. Vascular/Lymphatic: Aortic caliber within normal limits. Vascular patency is not evaluated in the absence of IV contrast. No lymphadenopathy. Reproductive: Negative. Other: No pelvic free fluid. Musculoskeletal: No acute osseous abnormality identified. Proximal right femur ORIF is new since last year. IMPRESSION: 1. Confluent bilateral perihilar airspace opacity in the visible lungs. Cardiomegaly but no pleural fluid. Differential considerations include aspiration, bilateral pneumonia, less likely edema. 2. Evaluation of the abdomen and pelvis mildly limited due to motion and lack of contrast. 3. There is trace free fluid in the right gutter, nonspecific. But otherwise no acute or inflammatory process identified. Redundant gas-filled large bowel is similar to a 2020 CT and chronic colonic dysfunction is favored. Electronically Signed   By: Odessa Fleming M.D.   On: 11/26/2019 17:27   DG Swallowing Func-Speech Pathology  Result Date: 11/27/2019 Objective Swallowing Evaluation: Type  of Study: Bedside Swallow Evaluation  Patient Details Name: Micheil Klaus MRN: [id number] Date of Birth: 11/22/1950 Today's Date: 11/27/2019 Time: SLP Start Time (ACUTE ONLY): 1330 -SLP Stop Time (ACUTE ONLY): 1343 SLP Time Calculation (min) (ACUTE ONLY): 13 min Past Medical History: Past Medical History: Diagnosis Date  Anxiety   Dementia (HCC)   Hypertension   Learning disorder   Seizures (HCC)  Past Surgical History: Past Surgical History: Procedure Laterality Date  HIP SURGERY    HPI: Pt  is a 69 y.o. male history of seizure disorder, intellectual disability, parkinsonism, hypertension. Patient was feeding himself a hot dog and apparently did not chew his food and may have aspirated. Abdominal x-ray: There is generalized increased bowel gas throughout the stomach, small and large bowel suggesting slow transit/ileus. CXR 7/28: Bilateral right greater than left lower lobe airspace disease which may reflect pneumonia or aspiration. CT abdomen: Confluent bilateral perihilar airspace opacity in the visible lungs. Cardiomegaly but no pleural fluid. Differential considerations include aspiration, bilateral pneumonia, less likely edema. no acute or inflammatory process identified. Redundant gas-filled large bowel is similar to a 2020 CT and chronic colonic dysfunction is favored.  No data recorded Assessment / Plan / Recommendation CHL IP CLINICAL IMPRESSIONS 11/27/2019 Clinical Impression Pt presented with significant cervical kyphosis which increased the size of the laryngopharynx. His swallow mechanism was within functional limits with intermittent piecemeal deglutition of larger boluses. No instance of aspiration were demonstrated with solids or liquids. Pt's sister reported that the pt demonstrates a significantly increased intake rate and therefore needs full supervision and his meats cut to at most dime-sized pieces. A dysphagia 2 diet with thin liquids is recommended at this time with observance of swallowing precautions and full supervision during meals. SLP will follow to ensure diet tolerance. SLP Visit Diagnosis Dysphagia, pharyngeal phase (R13.13) Attention and concentration deficit following -- Frontal lobe and executive function deficit following -- Impact on safety and function Moderate aspiration risk   CHL IP TREATMENT RECOMMENDATION 11/27/2019 Treatment Recommendations F/U MBS in --- days (Comment)   Prognosis 11/27/2019 Prognosis for Safe Diet Advancement Good Barriers to Reach  Goals Time post onset Barriers/Prognosis Comment -- CHL IP DIET RECOMMENDATION 11/27/2019 SLP Diet Recommendations Dysphagia 2 (Fine chop) solids;Thin liquid Liquid Administration via Cup;Straw Medication Administration Crushed with puree Compensations Slow rate;Small sips/bites Postural Changes Remain semi-upright after after feeds/meals (Comment);Seated upright at 90 degrees   CHL IP OTHER RECOMMENDATIONS 11/27/2019 Recommended Consults -- Oral Care Recommendations Oral care BID Other Recommendations --   CHL IP FOLLOW UP RECOMMENDATIONS 11/27/2019 Follow up Recommendations None   CHL IP FREQUENCY AND DURATION 11/27/2019 Speech Therapy Frequency (ACUTE ONLY) min 2x/week Treatment Duration 1 week      CHL IP ORAL PHASE 11/27/2019 Oral Phase WFL Oral - Pudding Teaspoon -- Oral - Pudding Cup -- Oral - Honey Teaspoon -- Oral - Honey Cup -- Oral - Nectar Teaspoon -- Oral - Nectar Cup -- Oral - Nectar Straw WFL Oral - Thin Teaspoon -- Oral - Thin Cup -- Oral - Thin Straw WFL Oral - Puree WFL Oral - Mech Soft -- Oral - Regular -- Oral - Multi-Consistency -- Oral - Pill -- Oral Phase - Comment --  CHL IP PHARYNGEAL PHASE 11/27/2019 Pharyngeal Phase WFL Pharyngeal- Pudding Teaspoon -- Pharyngeal -- Pharyngeal- Pudding Cup -- Pharyngeal -- Pharyngeal- Honey Teaspoon -- Pharyngeal -- Pharyngeal- Honey Cup -- Pharyngeal -- Pharyngeal- Nectar Teaspoon -- Pharyngeal -- Pharyngeal- Nectar Cup -- Pharyngeal -- Pharyngeal- Nectar Straw -- Pharyngeal --  Pharyngeal- Thin Teaspoon -- Pharyngeal -- Pharyngeal- Thin Cup -- Pharyngeal -- Pharyngeal- Thin Straw -- Pharyngeal -- Pharyngeal- Puree -- Pharyngeal -- Pharyngeal- Mechanical Soft -- Pharyngeal -- Pharyngeal- Regular -- Pharyngeal -- Pharyngeal- Multi-consistency -- Pharyngeal -- Pharyngeal- Pill -- Pharyngeal -- Pharyngeal Comment --  CHL IP CERVICAL ESOPHAGEAL PHASE 11/27/2019 Cervical Esophageal Phase WFL Pudding Teaspoon -- Pudding Cup -- Honey Teaspoon -- Honey Cup -- Nectar  Teaspoon -- Nectar Cup -- Nectar Straw -- Thin Teaspoon -- Thin Cup -- Thin Straw -- Puree -- Mechanical Soft -- Regular -- Multi-consistency -- Pill -- Cervical Esophageal Comment -- Shanika I. Vear Clock, MS, CCC-SLP Acute Rehabilitation Services Office number [phone] Pager [phone] Scheryl Marten 11/27/2019, 2:39 PM                Joycelyn Das, MD  [facility] 11/28/2019

## 2019-11-28 NOTE — Progress Notes (Signed)
Pharmacy Antibiotic Note  Hunter Chavez is a 69 y.o. male admitted on 11/25/2019 with pneumonia after choking episode.  Pharmacy has been consulted for Zosyn dosing.   ID: WBC 11.2 up. Scr 1.05 . Afebrile.  7/28 Ancef x1  7/29 Zosyn>> 7/29 Vanc>>7/31  7/28 BX: NGTD  Plan:  Con't Zosyn 3.375g IV q 8hr. Pharmacy will sign off. Please reconsult for further dosing assitance. No renal adjustement needed    Height: 5\' 4"  (162.6 cm) Weight: 47.9 kg (105 lb 9.6 oz) IBW/kg (Calculated) : 59.2  Temp (24hrs), Avg:98.6 F (37 C), Min:98.3 F (36.8 C), Max:98.7 F (37.1 C)  Recent Labs  Lab 11/25/19 2041 11/25/19 2236 11/26/19 0036 11/26/19 0315 11/26/19 11/28/19 11/26/19 0825 11/27/19 0445 11/28/19 0215  WBC 13.0*  --   --  7.6  --   --  11.0* 11.2*  CREATININE 1.49*  --   --  1.30*  --   --  1.08 1.05  LATICACIDVEN  --  2.3* 1.7 3.3* 1.7 1.8  --   --     Estimated Creatinine Clearance: 46.9 mL/min (by C-G formula based on SCr of 1.05 mg/dL).    No Known Allergies  . Gibson Lad S. 11/30/19, PharmD, BCPS Clinical Staff Pharmacist [email or web address] Merilynn Finland 11/28/2019 1:59 PM

## 2019-11-29 DIAGNOSIS — F0390 Unspecified dementia without behavioral disturbance: Secondary | ICD-10-CM

## 2019-11-29 LAB — BASIC METABOLIC PANEL
Anion gap: 8 (ref 5–15)
BUN: 6 mg/dL — ABNORMAL LOW (ref 8–23)
CO2: 28 mmol/L (ref 22–32)
Calcium: 9 mg/dL (ref 8.9–10.3)
Chloride: 105 mmol/L (ref 98–111)
Creatinine, Ser: 1.05 mg/dL (ref 0.61–1.24)
GFR calc Af Amer: >60 mL/min (ref >60)
GFR calc non Af Amer: >60 mL/min (ref >60)
Glucose, Bld: 135 mg/dL — ABNORMAL HIGH (ref 70–99)
Potassium: 3.4 mmol/L — ABNORMAL LOW (ref 3.5–5.1)
Sodium: 141 mmol/L (ref 135–145)

## 2019-11-29 LAB — CBC
HCT: 36.1 % — ABNORMAL LOW (ref 39.0–52.0)
Hemoglobin: 11.2 g/dL — ABNORMAL LOW (ref 13.0–17.0)
MCH: 26.7 pg (ref 26.0–34.0)
MCHC: 31 g/dL (ref 30.0–36.0)
MCV: 86 fL (ref 80.0–100.0)
Platelets: 151 10*3/uL (ref 150–400)
RBC: 4.2 MIL/uL — ABNORMAL LOW (ref 4.22–5.81)
RDW: 12.7 % (ref 11.5–15.5)
WBC: 9.5 10*3/uL (ref 4.0–10.5)
nRBC: 0 % (ref 0.0–0.2)

## 2019-11-29 LAB — MAGNESIUM: Magnesium: 1.8 mg/dL (ref 1.7–2.4)

## 2019-11-29 LAB — GLUCOSE, CAPILLARY
Glucose-Capillary: 100 mg/dL — ABNORMAL HIGH (ref 70–99)
Glucose-Capillary: 120 mg/dL — ABNORMAL HIGH (ref 70–99)
Glucose-Capillary: 121 mg/dL — ABNORMAL HIGH (ref 70–99)

## 2019-11-29 LAB — PHOSPHORUS: Phosphorus: 3 mg/dL (ref 2.5–4.6)

## 2019-11-29 MED ORDER — AMOXICILLIN-POT CLAVULANATE 875-125 MG PO TABS
1.0000 | ORAL_TABLET | Freq: Two times a day (BID) | ORAL | 0 refills | Status: AC
Start: 2019-11-29 — End: 2019-12-02

## 2019-11-29 NOTE — Progress Notes (Signed)
Order received to discharge patient.  Telemetry monitor removed and CCMD notified.  PIV access removed.  Discharge instructions, follow up, medications and instructions for their use discussed with patient's sister.

## 2019-11-29 NOTE — Discharge Summary (Signed)
Physician Discharge Summary  Hunter Chavez ZSW:[id number] DOB: 1950-12-07 DOA: 11/25/2019  PCP: Angelica Chessman, MD  Admit date: 11/25/2019 Discharge date: 11/29/2019  Admitted From: Home  Discharge disposition: Home   Recommendations for Outpatient Follow-Up:   . Follow up with your primary care provider in one week.  . Check CBC, BMP, magnesium in the next visit  Discharge Diagnosis:   Principal Problem:   Acute respiratory failure with hypoxia (HCC) Active Problems:   Aspiration pneumonia of both lower lobes (HCC)   Parkinson's disease (HCC)   Dementia (HCC)   Essential hypertension   Pressure injury of skin   Discharge Condition: Improved.  Diet recommendation: Low sodium, heart healthy. Dysphagia II  Wound care: None.  Code status: Full.   History of Present Illness:   Hunter Chavez a 69 y.o.malewithhistory of seizure disorder, intellectual disability, parkinsonism, hypertension had a choking episode  at home when patient tried to eat his food. Patient was being helped by patient's sister for eating when patient took one of the bites without his sister's knowledge and choked on it. Following which patient had 2 episodes of vomiting. Patient was in respiratory distress and was taken to the ER at [facility]. As per the sister patient had  been having frequent choking episodes recently. In the ED,  patient was hypoxic and required high flow oxygen 15 L to maintain sats. Chest x-ray showed bilateral infiltrates. Chest x-ray also showed features concerning for possible ileus. KUB was done which shows features concerning for same. Patient's initial ABG was 7.30 with a PCO2 of 54 which improved to 1.37 with PCO2 of 54. Labs are remarkable for sodium of 141 creatinine 1.4 lactic acid of 2.3 which improved to 1.7 again worsened one 3.3. For which a fluid bolus was ordered. Blood sugars of 197. WBC count 13. Patient became febrile with a temperature  of 104 F for which patient was started on antibiotics after blood cultures obtained.   Patient was then considered for admission to the hospital.   Hospital Course:   Following conditions were addressed during hospitalization as listed below,  Acute respiratory failure with hypoxia likely secondary to aspiration.   resolved.  Speech evaluated the patient and recommend dysphagia 2 diet.   Lactic acid of 1.7.  Procalcitonin 6.7.  Patient received Zosyn during hospitalization.  Will be given Augmentin on discharge to complete the course.  Patient is currently off oxygen.  Possible ileus as seen in the x-ray.  CT with no changes from prior CT scan..  No clinical signs of obstruction.  Tolerating p.o. okay.  Continue dysphagia 2 diet on discharge.  Hypophosphatemia, hypokalemia.  Improved with replacement.    Acute kidney injury likely from volume depletion from vomiting.  Resolved.  Essential Hypertension resume home Cardizem   History of seizure disorder on Keppra to continue.  History of psychosis on Zyprexa.    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 Presently not on any medications. Neurologist is trying to wean patient off Zyprexa.  Hyperglycemia.  Likely reactive.  Hemoglobin A1c of 5.7.  Pressure Ulcer stage I coccyx. Prevention protocol to be adopted.  Disposition.  At this time, patient is stable for disposition home.  I spoke with the patient's family at bedside. Medical Consultants:    None.  Procedures:    None Subjective:   Today, patient continues to look better.  Poor historian.  Follows few commands.  Discharge Exam:   Vitals:  11/29/19 0905 11/29/19 1304  BP:  (!) 130/63  Pulse: 82 77  Resp: 20 20  Temp:  98.3 F (36.8 C)  SpO2: 92% 92%   Vitals:   11/29/19 0412 11/29/19 0855 11/29/19 0905 11/29/19 1304  BP: (!) 159/69 (!) 147/55  (!) 130/63  Pulse: 64 58 82 77  Resp: 20 (!) 29 20 20   Temp: 98.2 F  (36.8 C) 98.4 F (36.9 C)  98.3 F (36.8 C)  TempSrc: Oral Oral  Oral  SpO2: 92% 92% 92% 92%  Weight:      Height:        General: Alert awake, not in obvious distress, likely at baseline mentation.  Following few commands. HENT: pupils equally reacting to light,  No scleral pallor or icterus noted. Oral mucosa is moist.  Chest:   Diminished breath sounds bilaterally.  CVS: S1 &S2 heard. No murmur.  Regular rate and rhythm. Abdomen: Soft, nontender, nondistended.  Bowel sounds are heard.   Extremities: No cyanosis, clubbing or edema.  Peripheral pulses are palpable. Psych: Alert, awake and oriented, normal mood CNS:  No cranial nerve deficits.  Power equal in all extremities.   Skin: Warm and dry.  Stage I pressure ulceration on the coccyx area on presentation.  The results of significant diagnostics from this hospitalization (including imaging, microbiology, ancillary and laboratory) are listed below for reference.     Diagnostic Studies:   CT ABDOMEN PELVIS WO CONTRAST  Result Date: 11/26/2019 CLINICAL DATA:  69 year old male with abdominal distension. Negative for COVID-19 yesterday. EXAM: CT ABDOMEN AND PELVIS WITHOUT CONTRAST TECHNIQUE: Multidetector CT imaging of the abdomen and pelvis was performed following the standard protocol without IV contrast. COMPARISON:  CT Abdomen and Pelvis 08/11/2018. FINDINGS: Lower chest: Confluent bilateral perihilar airspace opacity. Middle lobes relatively spared. Cardiomegaly appears stable since last year. No pericardial or pleural effusion. Hepatobiliary: Stable and benign appearing low-density 2 cm area in the left hepatic lobe. Motion artifact. The gallbladder is poorly delineated today. There is trace fluid in the right gutter just below the tip of the liver on series 3, image 45. Pancreas: Negative noncontrast appearance. Spleen: Negative. Adrenals/Urinary Tract: Noncontrast adrenal glands and kidneys appears stable since last year. Several  small benign appearing renal cysts. Unremarkable urinary bladder. Chronic pelvic phleboliths. No nephrolithiasis. Stomach/Bowel: Retained stool in the rectum. But mostly gas and fluid in the more proximal large bowel. The sigmoid is redundant, gas-filled occupying much of the central abdomen. Decompressed junction of the descending and sigmoid colon, but similar gas-filled appearance of the transverse colon and multiple small bowel loops. The gas pattern is similar to the 2020 CT. No transition point identified. The stomach is largely decompressed. No free air identified. Vascular/Lymphatic: Aortic caliber within normal limits. Vascular patency is not evaluated in the absence of IV contrast. No lymphadenopathy. Reproductive: Negative. Other: No pelvic free fluid. Musculoskeletal: No acute osseous abnormality identified. Proximal right femur ORIF is new since last year. IMPRESSION: 1. Confluent bilateral perihilar airspace opacity in the visible lungs. Cardiomegaly but no pleural fluid. Differential considerations include aspiration, bilateral pneumonia, less likely edema. 2. Evaluation of the abdomen and pelvis mildly limited due to motion and lack of contrast. 3. There is trace free fluid in the right gutter, nonspecific. But otherwise no acute or inflammatory process identified. Redundant gas-filled large bowel is similar to a 2020 CT and chronic colonic dysfunction is favored. Electronically Signed   By: 08/13/2018 M.D.   On: 11/26/2019 17:27   DG  Chest Port 1 View  Result Date: 11/25/2019 CLINICAL DATA:  Possibly choked on hot dog EXAM: PORTABLE CHEST 1 VIEW COMPARISON:  CT 08/11/2018, chest x-ray 11/27/2015 FINDINGS: Bilateral lower lobe airspace disease. Mild cardiomegaly. No pleural effusion or pneumothorax. Lucency beneath both diaphragms. IMPRESSION: 1. Bilateral right greater than left lower lobe airspace disease which may reflect pneumonia or aspiration. Cardiomegaly 2. Lucency beneath both diaphragms  suspected to represent marked gaseous dilatation of bowel and stomach, though recommend dedicated abdominal radiographs to exclude free air. Critical Value/emergent results were called by telephone at the time of interpretation on 11/25/2019 at 9:28 pm to provider DAVID YAO , who verbally acknowledged these results. Electronically Signed   By: Jasmine Pang M.D.   On: 11/25/2019 21:28   DG Abd Portable 2 Views  Result Date: 11/25/2019 CLINICAL DATA:  Possible free air under the diaphragm on chest x-ray. Abdominal distension. Aspiration. EXAM: PORTABLE ABDOMEN - 2 VIEW COMPARISON:  Chest radiograph earlier this day. FINDINGS: Supine and upright views of the abdomen obtained. Lucency under the left hemidiaphragm represents gaseous distention of stomach with air-fluid level. Lucency under the right hemidiaphragm represent gaseous distention of colon. There is mild generalized diffuse gaseous distention of both large and small bowel throughout the abdomen. No evidence of free intra-abdominal air. No formed stool. Calcifications in the pelvis are typical of phleboliths. Surgical hardware in the right proximal femur is partially included. No acute osseous abnormalities are seen. IMPRESSION: No evidence of free intra-abdominal air. Air under the hemidiaphragms on chest radiograph represents gaseous distention of stomach and colon. There is generalized increased bowel gas throughout the stomach, small and large bowel suggesting slow transit/ileus. Electronically Signed   By: Narda Rutherford M.D.   On: 11/25/2019 21:53     Labs:   Basic Metabolic Panel: Recent Labs  Lab 11/25/19 2041 11/25/19 2250 11/26/19 0027 11/26/19 0027 11/26/19 0315 11/26/19 0315 11/27/19 0445 11/27/19 0445 11/28/19 0215 11/29/19 0246  NA 141   < > 145  --  141  --  140  --  141 141  K 3.9   < > 3.7   < > 3.4*   < > 3.2*   < > 3.5 3.4*  CL 100  --   --   --  107  --  106  --  106 105  CO2 27  --   --   --  24  --  28  --  28  28  GLUCOSE 197*  --   --   --  156*  --  106*  --  88 135*  BUN 18  --   --   --  14  --  7*  --  5* 6*  CREATININE 1.49*  --   --   --  1.30*  --  1.08  --  1.05 1.05  CALCIUM 9.6  --   --   --  8.9  --  8.6*  --  8.9 9.0  MG  --   --   --   --   --   --  1.9  --  1.8 1.8  PHOS  --   --   --   --   --   --  1.8*  --  2.9 3.0   < > = values in this interval not displayed.   GFR Estimated Creatinine Clearance: 46.9 mL/min (by C-G formula based on SCr of 1.05 mg/dL). Liver Function Tests: Recent Labs  Lab 11/26/19  0315 11/27/19 0445  AST 28 20  ALT 28 19  ALKPHOS 69 53  BILITOT 0.9 0.6  PROT 6.5 5.8*  ALBUMIN 3.3* 2.7*   No results for input(s): LIPASE, AMYLASE in the last 168 hours. No results for input(s): AMMONIA in the last 168 hours. Coagulation profile Recent Labs  Lab 11/26/19 0315  INR 1.2    CBC: Recent Labs  Lab 11/25/19 2041 11/25/19 2250 11/26/19 0027 11/26/19 0315 11/27/19 0445 11/28/19 0215 11/29/19 0246  WBC 13.0*  --   --  7.6 11.0* 11.2* 9.5  NEUTROABS 11.3*  --   --  6.6  --   --   --   HGB 13.7   < > 13.6 11.8* 10.5* 10.5* 11.2*  HCT 44.3   < > 40.0 37.7* 33.7* 33.5* 36.1*  MCV 86.4  --   --  84.5 86.2 85.2 86.0  PLT 160  --   --  138* 115* 122* 151   < > = values in this interval not displayed.   Cardiac Enzymes: No results for input(s): CKTOTAL, CKMB, CKMBINDEX, TROPONINI in the last 168 hours. BNP: Invalid input(s): POCBNP CBG: Recent Labs  Lab 11/28/19 0625 11/28/19 1051 11/29/19 0013 11/29/19 0622 11/29/19 1309  GLUCAP 95 114* 121* 100* 120*   D-Dimer No results for input(s): DDIMER in the last 72 hours. Hgb A1c No results for input(s): HGBA1C in the last 72 hours. Lipid Profile No results for input(s): CHOL, HDL, LDLCALC, TRIG, CHOLHDL, LDLDIRECT in the last 72 hours. Thyroid function studies No results for input(s): TSH, T4TOTAL, T3FREE, THYROIDAB in the last 72 hours.  Invalid input(s): FREET3 Anemia work up No  results for input(s): VITAMINB12, FOLATE, FERRITIN, TIBC, IRON, RETICCTPCT in the last 72 hours. Microbiology Recent Results (from the past 240 hour(s))  SARS Coronavirus 2 by RT PCR (hospital order, performed in [facility] hospital lab) Nasopharyngeal Peripheral     Status: None   Collection Time: 11/25/19 10:37 PM   Specimen: Peripheral; Nasopharyngeal  Result Value Ref Range Status   SARS Coronavirus 2 NEGATIVE NEGATIVE Final    Comment: (NOTE) SARS-CoV-2 target nucleic acids are NOT DETECTED.  The SARS-CoV-2 RNA is generally detectable in upper and lower respiratory specimens during the acute phase of infection. The lowest concentration of SARS-CoV-2 viral copies this assay can detect is 250 copies / mL. A negative result does not preclude SARS-CoV-2 infection and should not be used as the sole basis for treatment or other patient management decisions.  A negative result may occur with improper specimen collection / handling, submission of specimen other than nasopharyngeal swab, presence of viral mutation(s) within the areas targeted by this assay, and inadequate number of viral copies (<250 copies / mL). A negative result must be combined with clinical observations, patient history, and epidemiological information.  Fact Sheet for Patients:   BoilerBrush.com.cy  Fact Sheet for Healthcare Providers: https://pope.com/  This test is not yet approved or  cleared by the [place] FDA and has been authorized for detection and/or diagnosis of SARS-CoV-2 by FDA under an Emergency Use Authorization (EUA).  This EUA will remain in effect (meaning this test can be used) for the duration of the COVID-19 declaration under Section 564(b)(1) of the Act, 21 U.S.C. section 360bbb-3(b)(1), unless the authorization is terminated or revoked sooner.  Performed at [facility], [address] [place], [place] [address]   Blood  culture (routine x 2)     Status: None (Preliminary result)  Collection Time: 11/25/19 11:00 PM   Specimen: BLOOD  Result Value Ref Range Status   Specimen Description   Final    BLOOD RIGHT ANTECUBITAL Performed at [facility] [place], [address] [place], [place] [address]    Special Requests   Final    BOTTLES DRAWN AEROBIC AND ANAEROBIC Blood Culture adequate volume Performed at [facility] [place], [address] [place], [place] [address]    Culture   Final    NO GROWTH 3 DAYS Performed at [facility], [address] [place], [place] [address]    Report Status PENDING  Incomplete     Discharge Instructions:   Discharge Instructions    DIET DYS 2   Complete by: As directed    Fluid consistency: Thin   Discharge instructions   Complete by: As directed    Follow up with the primary care provider in one week. Complete the course of antibiotics. Seek medical attention for worsening symptoms including fever, increasing difficulty breathing.   Increase activity slowly   Complete by: As directed    No wound care   Complete by: As directed      Allergies as of 11/29/2019   No Known Allergies     Medication List    STOP taking these medications   diltiazem 120 MG tablet Commonly known as: CARDIZEM     TAKE these medications   amoxicillin-clavulanate 875-125 MG tablet Commonly known as: Augmentin Take 1 tablet by mouth 2 (two) times daily for 3 days.   brimonidine 0.2 % ophthalmic solution Commonly known as: ALPHAGAN Place 1 drop into both eyes 3 (three) times daily.   Cholecalciferol 50 MCG (2000 UT) Caps Take 2,000 Units by mouth daily.   diltiazem 240 MG 24 hr capsule Commonly known as: CARDIZEM CD Take 240 mg by mouth daily.   donepezil 10 MG tablet Commonly known as: ARICEPT Take 20 mg by mouth daily.   dorzolamide-timolol 22.3-6.8 MG/ML ophthalmic solution Commonly known as: COSOPT Place 1 drop into both eyes 2 (two) times  daily.   famotidine 10 MG tablet Commonly known as: PEPCID Take 10 mg by mouth daily.   latanoprost 0.005 % ophthalmic solution Commonly known as: XALATAN Place 1 drop into both eyes at bedtime.   levETIRAcetam 500 MG tablet Commonly known as: KEPPRA Take 1,000 mg by mouth at bedtime.   LORazepam 0.5 MG tablet Commonly known as: ATIVAN Take 0.5 mg by mouth every 8 (eight) hours as needed for anxiety.   melatonin 5 MG Tabs Take 5 mg by mouth daily as needed (to relax).   OLANZapine 10 MG tablet Commonly known as: ZYPREXA Take 10 mg by mouth at bedtime.   pravastatin 20 MG tablet Commonly known as: PRAVACHOL Take 20 mg by mouth daily.         Time coordinating discharge: 39 minutes  Signed:  Soham Hollett  [facility] 11/29/2019, 2:49 PM

## 2019-12-01 LAB — CULTURE, BLOOD (ROUTINE X 2)
Culture: NO GROWTH
Special Requests: ADEQUATE

## 2021-03-21 ENCOUNTER — Emergency Department (HOSPITAL_BASED_OUTPATIENT_CLINIC_OR_DEPARTMENT_OTHER): Payer: Medicare Other

## 2021-03-21 ENCOUNTER — Encounter (HOSPITAL_BASED_OUTPATIENT_CLINIC_OR_DEPARTMENT_OTHER): Payer: Self-pay

## 2021-03-21 ENCOUNTER — Inpatient Hospital Stay (HOSPITAL_BASED_OUTPATIENT_CLINIC_OR_DEPARTMENT_OTHER)
Admission: EM | Admit: 2021-03-21 | Discharge: 2021-03-23 | DRG: 871 | Disposition: A | Discharge status: Home / Self Care | Payer: Medicare Other | Attending: Internal Medicine | Admitting: Internal Medicine

## 2021-03-21 ENCOUNTER — Other Ambulatory Visit: Payer: Self-pay

## 2021-03-21 DIAGNOSIS — N179 Acute kidney failure, unspecified: Secondary | ICD-10-CM | POA: Diagnosis present

## 2021-03-21 DIAGNOSIS — F819 Developmental disorder of scholastic skills, unspecified: Secondary | ICD-10-CM | POA: Diagnosis not present

## 2021-03-21 DIAGNOSIS — J690 Pneumonitis due to inhalation of food and vomit: Secondary | ICD-10-CM | POA: Diagnosis present

## 2021-03-21 DIAGNOSIS — F0390 Unspecified dementia without behavioral disturbance: Secondary | ICD-10-CM | POA: Diagnosis not present

## 2021-03-21 DIAGNOSIS — E872 Acidosis, unspecified: Secondary | ICD-10-CM | POA: Diagnosis not present

## 2021-03-21 DIAGNOSIS — Z20822 Contact with and (suspected) exposure to covid-19: Secondary | ICD-10-CM | POA: Diagnosis not present

## 2021-03-21 DIAGNOSIS — D696 Thrombocytopenia, unspecified: Secondary | ICD-10-CM | POA: Diagnosis not present

## 2021-03-21 DIAGNOSIS — G40909 Epilepsy, unspecified, not intractable, without status epilepticus: Secondary | ICD-10-CM | POA: Diagnosis not present

## 2021-03-21 DIAGNOSIS — Z79899 Other long term (current) drug therapy: Secondary | ICD-10-CM

## 2021-03-21 DIAGNOSIS — A419 Sepsis, unspecified organism: Secondary | ICD-10-CM | POA: Diagnosis not present

## 2021-03-21 DIAGNOSIS — Z8249 Family history of ischemic heart disease and other diseases of the circulatory system: Secondary | ICD-10-CM

## 2021-03-21 DIAGNOSIS — R0902 Hypoxemia: Secondary | ICD-10-CM | POA: Diagnosis not present

## 2021-03-21 DIAGNOSIS — I10 Essential (primary) hypertension: Secondary | ICD-10-CM | POA: Diagnosis not present

## 2021-03-21 LAB — CBC WITH DIFFERENTIAL/PLATELET
Abs Immature Granulocytes: 0.02 10*3/uL (ref 0.00–0.07)
Basophils Absolute: 0 10*3/uL (ref 0.0–0.1)
Basophils Relative: 0 %
Eosinophils Absolute: 0.1 10*3/uL (ref 0.0–0.5)
Eosinophils Relative: 1 %
HCT: 46 % (ref 39.0–52.0)
Hemoglobin: 14.3 g/dL (ref 13.0–17.0)
Immature Granulocytes: 0 %
Lymphocytes Relative: 26 %
Lymphs Abs: 1.5 10*3/uL (ref 0.7–4.0)
MCH: 26.9 pg (ref 26.0–34.0)
MCHC: 31.1 g/dL (ref 30.0–36.0)
MCV: 86.6 fL (ref 80.0–100.0)
Monocytes Absolute: 0.5 10*3/uL (ref 0.1–1.0)
Monocytes Relative: 9 %
Neutro Abs: 3.8 10*3/uL (ref 1.7–7.7)
Neutrophils Relative %: 64 %
Platelets: 168 10*3/uL (ref 150–400)
RBC: 5.31 MIL/uL (ref 4.22–5.81)
RDW: 12.5 % (ref 11.5–15.5)
WBC: 5.9 10*3/uL (ref 4.0–10.5)
nRBC: 0 % (ref 0.0–0.2)

## 2021-03-21 LAB — COMPREHENSIVE METABOLIC PANEL
ALT: 14 U/L (ref 0–44)
AST: 19 U/L (ref 15–41)
Albumin: 3.8 g/dL (ref 3.5–5.0)
Alkaline Phosphatase: 78 U/L (ref 38–126)
Anion gap: 8 (ref 5–15)
BUN: 13 mg/dL (ref 8–23)
CO2: 28 mmol/L (ref 22–32)
Calcium: 9.3 mg/dL (ref 8.9–10.3)
Chloride: 104 mmol/L (ref 98–111)
Creatinine, Ser: 1.52 mg/dL — ABNORMAL HIGH (ref 0.61–1.24)
GFR, Estimated: 50 mL/min — ABNORMAL LOW (ref >60)
Glucose, Bld: 184 mg/dL — ABNORMAL HIGH (ref 70–99)
Potassium: 3.9 mmol/L (ref 3.5–5.1)
Sodium: 140 mmol/L (ref 135–145)
Total Bilirubin: 0.5 mg/dL (ref 0.3–1.2)
Total Protein: 7.1 g/dL (ref 6.5–8.1)

## 2021-03-21 LAB — RESP PANEL BY RT-PCR (FLU A&B, COVID) ARPGX2
Influenza A by PCR: NEGATIVE
Influenza B by PCR: NEGATIVE
SARS Coronavirus 2 by RT PCR: NEGATIVE

## 2021-03-21 LAB — LACTIC ACID, PLASMA
Lactic Acid, Venous: 3.1 mmol/L (ref 0.5–1.9)
Lactic Acid, Venous: 1.2 mmol/L (ref 0.5–1.9)

## 2021-03-21 LAB — LIPASE, BLOOD: Lipase: 42 U/L (ref 11–51)

## 2021-03-21 MED ORDER — IPRATROPIUM-ALBUTEROL 0.5-2.5 (3) MG/3ML IN SOLN: 3.0000 mL | Freq: Four times a day (QID) | RESPIRATORY_TRACT | Status: DC | PRN

## 2021-03-21 MED ORDER — ENOXAPARIN SODIUM 40 MG/0.4ML IJ SOSY
40.0000 mg | PREFILLED_SYRINGE | INTRAMUSCULAR | Status: DC
  Administered 2021-03-21 – 2021-03-22 (×2): 40 mg via SUBCUTANEOUS
  Filled 2021-03-21 (×2): qty 0.4

## 2021-03-21 MED ORDER — LEVETIRACETAM IN NACL 500 MG/100ML IV SOLN
500.0000 mg | Freq: Once | INTRAVENOUS | Status: AC
  Administered 2021-03-21: 500 mg via INTRAVENOUS
  Filled 2021-03-21: qty 100

## 2021-03-21 MED ORDER — SODIUM CHLORIDE 0.9 % IV SOLN
1.0000 g | Freq: Once | INTRAVENOUS | Status: AC
  Administered 2021-03-21: 1 g via INTRAVENOUS
  Filled 2021-03-21: qty 10

## 2021-03-21 MED ORDER — SODIUM CHLORIDE 0.9 % IV SOLN: INTRAVENOUS | Status: DC | PRN

## 2021-03-21 MED ORDER — ACETAMINOPHEN 650 MG RE SUPP: 650.0000 mg | Freq: Four times a day (QID) | RECTAL | Status: DC | PRN

## 2021-03-21 MED ORDER — SODIUM CHLORIDE 0.9 % IV SOLN
500.0000 mg | Freq: Once | INTRAVENOUS | Status: AC
  Administered 2021-03-21: 500 mg via INTRAVENOUS
  Filled 2021-03-21: qty 500

## 2021-03-21 MED ORDER — FAMOTIDINE 20 MG PO TABS
10.0000 mg | ORAL_TABLET | Freq: Every day | ORAL | Status: DC
  Administered 2021-03-22 – 2021-03-23 (×2): 10 mg via ORAL
  Filled 2021-03-21 (×2): qty 1

## 2021-03-21 MED ORDER — ONDANSETRON HCL 4 MG PO TABS: 4.0000 mg | ORAL_TABLET | Freq: Four times a day (QID) | ORAL | Status: DC | PRN

## 2021-03-21 MED ORDER — DONEPEZIL HCL 10 MG PO TABS
20.0000 mg | ORAL_TABLET | Freq: Every day | ORAL | Status: DC
  Administered 2021-03-22 – 2021-03-23 (×2): 20 mg via ORAL
  Filled 2021-03-21 (×2): qty 2

## 2021-03-21 MED ORDER — LACTATED RINGERS IV SOLN: INTRAVENOUS | Status: DC

## 2021-03-21 MED ORDER — BRIMONIDINE TARTRATE 0.2 % OP SOLN
1.0000 [drp] | Freq: Three times a day (TID) | OPHTHALMIC | Status: DC
  Administered 2021-03-22 – 2021-03-23 (×4): 1 [drp] via OPHTHALMIC
  Filled 2021-03-21: qty 5

## 2021-03-21 MED ORDER — DORZOLAMIDE HCL-TIMOLOL MAL 2-0.5 % OP SOLN
1.0000 [drp] | Freq: Two times a day (BID) | OPHTHALMIC | Status: DC
  Administered 2021-03-22 – 2021-03-23 (×3): 1 [drp] via OPHTHALMIC
  Filled 2021-03-21: qty 10

## 2021-03-21 MED ORDER — IPRATROPIUM-ALBUTEROL 0.5-2.5 (3) MG/3ML IN SOLN
3.0000 mL | Freq: Once | RESPIRATORY_TRACT | Status: AC
  Administered 2021-03-21: 3 mL via RESPIRATORY_TRACT
  Filled 2021-03-21: qty 3

## 2021-03-21 MED ORDER — LATANOPROST 0.005 % OP SOLN
1.0000 [drp] | Freq: Every day | OPHTHALMIC | Status: DC
  Administered 2021-03-22: 1 [drp] via OPHTHALMIC
  Filled 2021-03-21: qty 2.5

## 2021-03-21 MED ORDER — ONDANSETRON HCL 4 MG/2ML IJ SOLN: 4.0000 mg | Freq: Four times a day (QID) | INTRAMUSCULAR | Status: DC | PRN

## 2021-03-21 MED ORDER — DILTIAZEM HCL ER COATED BEADS 240 MG PO CP24
240.0000 mg | ORAL_CAPSULE | Freq: Every day | ORAL | Status: DC
  Administered 2021-03-22 – 2021-03-23 (×2): 240 mg via ORAL
  Filled 2021-03-21 (×2): qty 1

## 2021-03-21 MED ORDER — ACETAMINOPHEN 325 MG PO TABS: 650.0000 mg | ORAL_TABLET | Freq: Four times a day (QID) | ORAL | Status: DC | PRN

## 2021-03-21 MED ORDER — PRAVASTATIN SODIUM 20 MG PO TABS
20.0000 mg | ORAL_TABLET | Freq: Every day | ORAL | Status: DC
  Administered 2021-03-22: 20 mg via ORAL
  Filled 2021-03-21: qty 1

## 2021-03-21 MED ORDER — SODIUM CHLORIDE 0.9 % IV SOLN
3.0000 g | Freq: Three times a day (TID) | INTRAVENOUS | Status: DC
  Administered 2021-03-21 – 2021-03-23 (×5): 3 g via INTRAVENOUS
  Filled 2021-03-21 (×6): qty 8

## 2021-03-21 MED ORDER — ONDANSETRON HCL 4 MG/2ML IJ SOLN
4.0000 mg | Freq: Once | INTRAMUSCULAR | Status: AC
  Administered 2021-03-21: 4 mg via INTRAVENOUS
  Filled 2021-03-21: qty 2

## 2021-03-21 NOTE — ED Triage Notes (Signed)
Per family member/care taker-pt started coughing and vomiting after eating ~40 min PTA-pt with mental disability-slow gait-tachypnea noted

## 2021-03-21 NOTE — H&P (Signed)
History and Physical    Liban Guedes TDS:[id number] DOB: 1950-07-18 DOA: 03/21/2021  PCP: Angelica Chessman, MD   Patient coming from: Home  Chief Complaint: Shortness of breath, cough  HPI: Hunter Chavez is a 70 y.o. male with medical history significant for HTN, seizures, dementia, cognitive delay and does not communicate well who presents for evaluation of shortness of breath with coughing.  Symptoms began in the afternoon when he was eating.  He lives with his sister and brother-in-law who are his POA.  They cut up his food and feed him.  After he ate as he normally does he wanted some Gatorade while he was drinking through a straw he began to cough and he had Gatorade come out of his mouth and nose according to the brother-in-law who witnessed it.  He then had persistent cough for the next hour and seem to become more short of breath so was brought to the emergency room.  He had no other complaints did not have fever he did not have any vomiting or diarrhea according to the brother-in-law.  Found to be hypoxic on room air and required oxygen by nasal cannula.  Does not use oxygen at home.  Clinical presentation it was suspected he has aspiration pneumonia and he was started on antibiotic with Rocephin and azithromycin in the emergency room He does not use tobacco alcohol or illicit drugs according to the brother-in-law  ED Course: Mr. Harn has been hemodynamically stable.  X-ray did not show any acute infiltrate but it was taken a few hours after the suspected aspiration event.  Work reveals a lactic acid of 3.1 initially but decreased to 1.2 after IV fluid hydration.  CBC is unremarkable.  Sodium 140 potassium 3.9 chloride 104 bicarb 28 creatinine 1.52 BUN 13 alkaline phosphatase 78 AST 19 ALT 14 lipase 42.  Baseline creatinine is 1.05.  COVID-19 negative.  Influenza A and B are negative.  Hospitalist service asked to admit for further management  Review of Systems:  Cannot obtain review  of systems secondary to dementia  Past Medical History:  Diagnosis Date   Anxiety    Dementia (HCC)    Hypertension    Learning disorder    Seizures (HCC)     Past Surgical History:  Procedure Laterality Date   HIP SURGERY      Social History  reports that he has never smoked. He has never used smokeless tobacco. He reports that he does not drink alcohol and does not use drugs.  No Known Allergies  Family History  Problem Relation Age of Onset   Hypertension Mother    Diabetes Mellitus II Mother    Hypertension Sister    Hypertension Brother      Prior to Admission medications   Medication Sig Start Date End Date Taking? Authorizing Provider  brimonidine (ALPHAGAN) 0.2 % ophthalmic solution Place 1 drop into both eyes 3 (three) times daily. 10/24/19   [provider]  Cholecalciferol 50 MCG (2000 UT) CAPS Take 2,000 Units by mouth daily.    [provider]  diltiazem (CARDIZEM CD) 240 MG 24 hr capsule Take 240 mg by mouth daily. 11/02/19   [provider]  donepezil (ARICEPT) 10 MG tablet Take 20 mg by mouth daily.     [provider]  dorzolamide-timolol (COSOPT) 22.3-6.8 MG/ML ophthalmic solution Place 1 drop into both eyes 2 (two) times daily. 11/21/19   [provider]  famotidine (PEPCID) 10 MG tablet Take 10 mg by mouth  daily.     [provider]  latanoprost (XALATAN) 0.005 % ophthalmic solution Place 1 drop into both eyes at bedtime. 08/18/19   [provider]  levETIRAcetam (KEPPRA) 500 MG tablet Take 1,000 mg by mouth at bedtime.     [provider]  LORazepam (ATIVAN) 0.5 MG tablet Take 0.5 mg by mouth every 8 (eight) hours as needed for anxiety.     [provider]  melatonin 5 MG TABS Take 5 mg by mouth daily as needed (to relax).    [provider]  OLANZapine (ZYPREXA) 10 MG tablet Take 10 mg by mouth at bedtime.    [provider]  pravastatin (PRAVACHOL) 20 MG  tablet Take 20 mg by mouth daily.    [provider]    Physical Exam: Vitals:   03/21/21 1605 03/21/21 1745 03/21/21 1826 03/21/21 2105  BP: (!) 141/54  (!) 159/107 (!) 143/71  Pulse: 63 85 90 (!) 105  Resp: (!) 25 20 (!) 32   Temp:    99.9 F (37.7 C)  TempSrc:    Oral  SpO2: 99% 98% 98%   Weight:      Height:        Constitutional: NAD, calm, comfortable Vitals:   03/21/21 1605 03/21/21 1745 03/21/21 1826 03/21/21 2105  BP: (!) 141/54  (!) 159/107 (!) 143/71  Pulse: 63 85 90 (!) 105  Resp: (!) 25 20 (!) 32   Temp:    99.9 F (37.7 C)  TempSrc:    Oral  SpO2: 99% 98% 98%   Weight:      Height:       General: WDWN, alert. Opens eyes but does not communicate Eyes: PERRL,  conjunctivae normal.  Sclera nonicteric HENT:  [place]/AT, external ears normal.  Nares patent without epistasis.  Mucous membranes are moist.  Neck: Soft, normal passive range of motion, supple, no masses, Trachea midline Respiratory: Equal but diminished breath sounds with mild rales and expiratory wheezing, no crackles. Normal respiratory effort. No accessory muscle use.  Cardiovascular: Regular rate and rhythm, 3/6 systolic murmur. No rubs / gallops. No extremity edema. 1+ pedal pulses.  Abdomen: Soft, nondistended, no rebound or guarding.  No masses palpated. Bowel sounds normoactive Musculoskeletal: FROM passively. no cyanosis. No joint deformity upper and lower extremities. Skin: Warm, dry, intact no rashes, lesions, ulcers. No induration Neurologic:  Moves extremities spontaneously. patella DTR +1 bilaterally.    Labs on Admission: I have personally reviewed following labs and imaging studies  CBC: Recent Labs  Lab 03/21/21 1556  WBC 5.9  NEUTROABS 3.8  HGB 14.3  HCT 46.0  MCV 86.6  PLT 168    Basic Metabolic Panel: Recent Labs  Lab 03/21/21 1631  NA 140  K 3.9  CL 104  CO2 28  GLUCOSE 184*  BUN 13  CREATININE 1.52*  CALCIUM 9.3    GFR: Estimated Creatinine  Clearance: 40.5 mL/min (A) (by C-G formula based on SCr of 1.52 mg/dL (H)).  Liver Function Tests: Recent Labs  Lab 03/21/21 1631  AST 19  ALT 14  ALKPHOS 78  BILITOT 0.5  PROT 7.1  ALBUMIN 3.8    Urine analysis: No results found for: COLORURINE, APPEARANCEUR, LABSPEC, PHURINE, GLUCOSEU, HGBUR, BILIRUBINUR, KETONESUR, PROTEINUR, UROBILINOGEN, NITRITE, LEUKOCYTESUR  Radiological Exams on Admission: DG Chest Portable 1 View  Result Date: 03/21/2021 CLINICAL DATA:  Hypoxia, history of aspiration pneumonia EXAM: PORTABLE CHEST 1 VIEW COMPARISON:  Chest radiograph 11/25/2019 FINDINGS: The heart is mildly  enlarged, unchanged. There is no focal consolidation or pulmonary edema. There is no pleural effusion or pneumothorax. There is unchanged scoliotic curvature of the upper thoracic spine. There is no acute osseous abnormality. IMPRESSION: No radiographic evidence of acute cardiopulmonary process. Electronically Signed   By: Lesia Hausen M.D.   On: 03/21/2021 15:49    EKG: Independently reviewed.  EKG shows normal sinus rhythm with a occasional PAC.  LVH by voltage criteria.  No acute ST elevation or depression.  QTc 434  Assessment/Plan Principal Problem:   Aspiration pneumonia  Mr. Bley is placed on telemetry for treatment and further workup.  Speech therapy to assess swallowing in the am.  Started on Unasyn for suspected aspiration pneumonia with cough, hypoxia that began when eating and drinking today.  Repeat CXR in am.  Check CBC, BMP in am NPO until swallow evaluation.  Blood cultures were obtained in the emergency room and will be monitored.  Active Problems:   AKI (acute kidney injury)  IVF hydration with LR at 75 ml/hr overnight Recheck renal function with labs in am.    Hypoxemia Supplemental oxygen by n/c to keep O2 sat 92-96%.  Flutter valve every 2 hours while awake.    Essential hypertension Monitor BP. Will renew antihypertensive in am once speech therapy has  evaluated and cleared to swallow     Seizure disorder Pt is on Keppra once a day. Will give IV tonight as pt is NPO. Will convert back to PO as able to after ST evaluation and swallowing assessed.      Dementia  Chronic. Continue aricept once able to   DVT prophylaxis: Lovenox for DVT prophylaxis.   Code Status:   Full Code  Family Communication:  Diagnosis and plan was discussed with patient brother-in-law who is at bedside.  Further recommendations to follow as clinically indicated. Disposition Plan:   Patient is from:  Home  Anticipated DC to:  Home  Admission status:  Observation   Shannon Adam MD [facility]  How to contact the [facility] Attending or Consulting provider 7A - 7P or covering provider during after hours 7P -7A, for this patient?   Check the care team in [facility] and look for a) attending/consulting [facility] provider listed and b) the [facility] team listed Log into [email or web address] and use [place]'s universal password to access. If you do not have the password, please contact the hospital operator. Locate the [facility] provider you are looking for under [facility] and page to a number that you can be directly reached. If you still have difficulty reaching the provider, please page the [facility] (Director on Call) for the Hospitalists listed on amion for assistance.  03/21/2021, 9:28 PM

## 2021-03-21 NOTE — ED Provider Notes (Signed)
[facility] EMERGENCY DEPARTMENT Provider Note   CSN: [id number] Arrival date & time: 03/21/21  1455     History Chief Complaint  Patient presents with   Cough    Hunter Chavez is a 70 y.o. male.  The history is provided by the patient, a caregiver and medical records. No language interpreter was used.  Cough Cough characteristics:  Non-productive Sputum characteristics:  Nondescript Severity:  Moderate Onset quality:  Sudden Duration:  3 hours Timing:  Constant Progression:  Unchanged Chronicity:  New Relieved by:  Nothing Worsened by:  Nothing Ineffective treatments:  None tried Associated symptoms: shortness of breath and wheezing   Associated symptoms: no chest pain, no chills, no headaches and no rhinorrhea       Past Medical History:  Diagnosis Date   Anxiety    Dementia (HCC)    Hypertension    Learning disorder    Seizures (HCC)     Patient Active Problem List   Diagnosis Date Noted   Aspiration pneumonia of both lower lobes (HCC) 11/26/2019   Parkinson's disease (HCC) 11/26/2019   Dementia (HCC) 11/26/2019   Acute respiratory failure with hypoxia (HCC) 11/26/2019   Essential hypertension 11/26/2019   Pressure injury of skin 11/26/2019    Past Surgical History:  Procedure Laterality Date   HIP SURGERY         Family History  Problem Relation Age of Onset   Hypertension Mother    Diabetes Mellitus II Mother    Hypertension Sister    Hypertension Brother     Social History   Tobacco Use   Smoking status: Never   Smokeless tobacco: Never  Vaping Use   Vaping Use: Never used  Substance Use Topics   Alcohol use: No   Drug use: No    Home Medications Prior to Admission medications   Medication Sig Start Date End Date Taking? Authorizing Provider  brimonidine (ALPHAGAN) 0.2 % ophthalmic solution Place 1 drop into both eyes 3 (three) times daily. 10/24/19   [provider]  Cholecalciferol 50 MCG (2000 UT) CAPS  Take 2,000 Units by mouth daily.    [provider]  diltiazem (CARDIZEM CD) 240 MG 24 hr capsule Take 240 mg by mouth daily. 11/02/19   [provider]  donepezil (ARICEPT) 10 MG tablet Take 20 mg by mouth daily.     [provider]  dorzolamide-timolol (COSOPT) 22.3-6.8 MG/ML ophthalmic solution Place 1 drop into both eyes 2 (two) times daily. 11/21/19   [provider]  famotidine (PEPCID) 10 MG tablet Take 10 mg by mouth daily.     [provider]  latanoprost (XALATAN) 0.005 % ophthalmic solution Place 1 drop into both eyes at bedtime. 08/18/19   [provider]  levETIRAcetam (KEPPRA) 500 MG tablet Take 1,000 mg by mouth at bedtime.     [provider]  LORazepam (ATIVAN) 0.5 MG tablet Take 0.5 mg by mouth every 8 (eight) hours as needed for anxiety.     [provider]  melatonin 5 MG TABS Take 5 mg by mouth daily as needed (to relax).    [provider]  OLANZapine (ZYPREXA) 10 MG tablet Take 10 mg by mouth at bedtime.    [provider]  pravastatin (PRAVACHOL) 20 MG tablet Take 20 mg by mouth daily.    [provider]    Allergies    Patient has no known allergies.  Review of Systems   Review of Systems  Constitutional:  Negative for chills.  HENT:  Negative for congestion and rhinorrhea.   Respiratory:  Positive for cough, chest tightness, shortness of breath and wheezing.   Cardiovascular:  Negative for chest pain.  Gastrointestinal:  Negative for abdominal distention.  Genitourinary:  Negative for dysuria and frequency.  Musculoskeletal:  Negative for back pain.  Neurological:  Negative for headaches.  Psychiatric/Behavioral:  Negative for agitation and confusion.    Physical Exam Updated Vital Signs BP (!) 193/71 (BP Location: Left Arm)   Pulse 79   Temp 97.8 F (36.6 C) (Oral)   Resp (!) 40   Ht 5\' 5"  (1.651 m)   Wt 62.6 kg   SpO2 90%   BMI 22.96 kg/m   Physical  Exam Vitals and nursing note reviewed.  Constitutional:      General: He is not in acute distress.    Appearance: He is well-developed. He is not ill-appearing, toxic-appearing or diaphoretic.  HENT:     Head: Normocephalic and atraumatic.     Nose: No congestion or rhinorrhea.     Mouth/Throat:     Mouth: Mucous membranes are moist.     Pharynx: No oropharyngeal exudate.  Eyes:     Extraocular Movements: Extraocular movements intact.     Conjunctiva/sclera: Conjunctivae normal.     Pupils: Pupils are equal, round, and reactive to light.  Cardiovascular:     Rate and Rhythm: Normal rate and regular rhythm.     Heart sounds: No murmur heard. Pulmonary:     Effort: No respiratory distress.     Breath sounds: Wheezing and rhonchi present. No rales.     Comments: Tachypnea Chest:     Chest wall: No tenderness.  Abdominal:     General: Abdomen is flat.     Palpations: Abdomen is soft.     Tenderness: There is no abdominal tenderness. There is no guarding or rebound.  Musculoskeletal:        General: No swelling or tenderness.     Cervical back: Neck supple. No tenderness.  Skin:    General: Skin is warm and dry.     Capillary Refill: Capillary refill takes less than 2 seconds.     Findings: No erythema.  Neurological:     Mental Status: He is alert. Mental status is at baseline.  Psychiatric:        Mood and Affect: Mood normal.    ED Results / Procedures / Treatments   Labs (all labs ordered are listed, but only abnormal results are displayed) Labs Reviewed  LACTIC ACID, PLASMA - Abnormal; Notable for the following components:      Result Value   Lactic Acid, Venous 3.1 (*)    All other components within normal limits  COMPREHENSIVE METABOLIC PANEL - Abnormal; Notable for the following components:   Glucose, Bld 184 (*)    Creatinine, Ser 1.52 (*)    GFR, Estimated 50 (*)    All other components within normal limits  CULTURE, BLOOD (ROUTINE X 2)  RESP PANEL BY  RT-PCR (FLU A&B, COVID) ARPGX2  CULTURE, BLOOD (ROUTINE X 2)  CBC WITH DIFFERENTIAL/PLATELET  LIPASE, BLOOD  LACTIC ACID, PLASMA    EKG EKG Interpretation  Date/Time:  Tuesday March 21 2021 15:56:17 EST Ventricular Rate:  64 PR Interval:  173 QRS Duration: 92 QT Interval:  420 QTC Calculation: 434 R Axis:   -3 Text Interpretation: Sinus rhythm Atrial premature complex Abnormal R-wave progression, early transition Left ventricular hypertrophy Abnormal T, consider ischemia, diffuse  leads NO prior ECG for comparison. T wave inversions present but no STEMI seen. Confirmed by Theda Belfast ([id number]) on 03/21/2021 4:13:05 PM  Radiology DG Chest Portable 1 View  Result Date: 03/21/2021 CLINICAL DATA:  Hypoxia, history of aspiration pneumonia EXAM: PORTABLE CHEST 1 VIEW COMPARISON:  Chest radiograph 11/25/2019 FINDINGS: The heart is mildly enlarged, unchanged. There is no focal consolidation or pulmonary edema. There is no pleural effusion or pneumothorax. There is unchanged scoliotic curvature of the upper thoracic spine. There is no acute osseous abnormality. IMPRESSION: No radiographic evidence of acute cardiopulmonary process. Electronically Signed   By: Lesia Hausen M.D.   On: 03/21/2021 15:49    Procedures Procedures   Medications Ordered in ED Medications  cefTRIAXone (ROCEPHIN) 1 g in sodium chloride 0.9 % 100 mL IVPB (has no administration in time range)  azithromycin (ZITHROMAX) 500 mg in sodium chloride 0.9 % 250 mL IVPB (has no administration in time range)  ondansetron (ZOFRAN) injection 4 mg (4 mg Intravenous Given 03/21/21 1553)  ipratropium-albuterol (DUONEB) 0.5-2.5 (3) MG/3ML nebulizer solution 3 mL (3 mLs Nebulization Given 03/21/21 1541)    ED Course  I have reviewed the triage vital signs and the nursing notes.  Pertinent labs & imaging results that were available during my care of the patient were reviewed by me and considered in my medical decision making  (see chart for details).    MDM Rules/Calculators/A&P                           Hunter Chavez is a 69 y.o. male with a past medical history significant for Parkinson's disease, dementia, hypertension, anxiety, and previous respiratory failure with hypoxia due to aspiration pneumonia who presents with cough and shortness of breath with vomiting.  According to family, patient was having some Peace to today but all of the before started up by the caregiver prior to eating.  Patient denied any choking or episodes of difficulty eating but then after drinking some fluid, patient started coughing and had some vomiting onto his shirt and on his nose.  Subsequently, patient started having more shortness of breath and difficulty breathing.  On arrival, patient was breathing around 40 times a minute and oxygen saturations were about 85% when I assessed initially.  Patient quickly placed on for his nasal cannula oxygen saturations in the 90s.  Per family, patient was at his baseline earlier today in the last few days.  He has had no recent fevers, chills, congestion, cough, nausea, vomiting, constipation, diarrhea, or other complaints.  6:17 PM Work-up has continued to return.  X-ray does not show pneumonia or evidence of pneumonitis however clinically I am concerned he is developing a pneumonitis.  He is still on 4 L to maintain oxygen saturations in the low 90s.  He does have a lactic acidosis which we will trend but his CBC did not show a leukocytosis.  COVID and flu negative.  Hemoglobin normal.  Creatinine is slightly elevated, will give some fluids.   Due to the new oxygen requirement with the hypoxia and the concern for aspiration, do not feel he is safe for discharge home.  Will discuss with admitting team if they feel antibiotics are warranted due to the concern for aspiration although he does not have a fever and does not have a white count at this time.  He will be admitted.  6:31 PM Internal  medicine team does want antibiotics.  Antibiotics ordered and patient  will be admitted for further management.   Final Clinical Impression(s) / ED Diagnoses Final diagnoses:  Aspiration pneumonia, unspecified aspiration pneumonia type, unspecified laterality, unspecified part of lung (HCC)    Clinical Impression: 1. Aspiration pneumonia, unspecified aspiration pneumonia type, unspecified laterality, unspecified part of lung (HCC)     Disposition: Admit  This note was prepared with assistance of Dragon voice recognition software. Occasional wrong-word or sound-a-like substitutions may have occurred due to the inherent limitations of voice recognition software.     Meila Berke, Canary Brim, MD 03/21/21 [phone]

## 2021-03-22 ENCOUNTER — Observation Stay (HOSPITAL_COMMUNITY): Payer: Medicare Other

## 2021-03-22 DIAGNOSIS — D696 Thrombocytopenia, unspecified: Secondary | ICD-10-CM | POA: Diagnosis present

## 2021-03-22 DIAGNOSIS — J690 Pneumonitis due to inhalation of food and vomit: Secondary | ICD-10-CM | POA: Diagnosis present

## 2021-03-22 DIAGNOSIS — A419 Sepsis, unspecified organism: Secondary | ICD-10-CM | POA: Diagnosis present

## 2021-03-22 DIAGNOSIS — E872 Acidosis, unspecified: Secondary | ICD-10-CM | POA: Diagnosis present

## 2021-03-22 DIAGNOSIS — G40909 Epilepsy, unspecified, not intractable, without status epilepticus: Secondary | ICD-10-CM | POA: Diagnosis present

## 2021-03-22 DIAGNOSIS — F819 Developmental disorder of scholastic skills, unspecified: Secondary | ICD-10-CM | POA: Diagnosis present

## 2021-03-22 DIAGNOSIS — R0902 Hypoxemia: Secondary | ICD-10-CM | POA: Diagnosis present

## 2021-03-22 DIAGNOSIS — Z20822 Contact with and (suspected) exposure to covid-19: Secondary | ICD-10-CM | POA: Diagnosis present

## 2021-03-22 DIAGNOSIS — N179 Acute kidney failure, unspecified: Secondary | ICD-10-CM | POA: Diagnosis present

## 2021-03-22 DIAGNOSIS — I10 Essential (primary) hypertension: Secondary | ICD-10-CM | POA: Diagnosis present

## 2021-03-22 DIAGNOSIS — Z8249 Family history of ischemic heart disease and other diseases of the circulatory system: Secondary | ICD-10-CM | POA: Diagnosis not present

## 2021-03-22 DIAGNOSIS — Z79899 Other long term (current) drug therapy: Secondary | ICD-10-CM | POA: Diagnosis not present

## 2021-03-22 DIAGNOSIS — F0390 Unspecified dementia without behavioral disturbance: Secondary | ICD-10-CM | POA: Diagnosis present

## 2021-03-22 LAB — CBC
HCT: 40.4 % (ref 39.0–52.0)
Hemoglobin: 12.6 g/dL — ABNORMAL LOW (ref 13.0–17.0)
MCH: 27.2 pg (ref 26.0–34.0)
MCHC: 31.2 g/dL (ref 30.0–36.0)
MCV: 87.3 fL (ref 80.0–100.0)
Platelets: 143 10*3/uL — ABNORMAL LOW (ref 150–400)
RBC: 4.63 MIL/uL (ref 4.22–5.81)
RDW: 12.6 % (ref 11.5–15.5)
WBC: 18.4 10*3/uL — ABNORMAL HIGH (ref 4.0–10.5)
nRBC: 0 % (ref 0.0–0.2)

## 2021-03-22 LAB — BASIC METABOLIC PANEL
Anion gap: 5 (ref 5–15)
BUN: 15 mg/dL (ref 8–23)
CO2: 30 mmol/L (ref 22–32)
Calcium: 8.8 mg/dL — ABNORMAL LOW (ref 8.9–10.3)
Chloride: 107 mmol/L (ref 98–111)
Creatinine, Ser: 1.33 mg/dL — ABNORMAL HIGH (ref 0.61–1.24)
GFR, Estimated: 58 mL/min — ABNORMAL LOW (ref >60)
Glucose, Bld: 102 mg/dL — ABNORMAL HIGH (ref 70–99)
Potassium: 4 mmol/L (ref 3.5–5.1)
Sodium: 142 mmol/L (ref 135–145)

## 2021-03-22 LAB — GLUCOSE, CAPILLARY
Glucose-Capillary: 95 mg/dL (ref 70–99)
Glucose-Capillary: 119 mg/dL — ABNORMAL HIGH (ref 70–99)

## 2021-03-22 LAB — HIV ANTIBODY (ROUTINE TESTING W REFLEX): HIV Screen 4th Generation wRfx: NONREACTIVE

## 2021-03-22 MED ORDER — LEVETIRACETAM 500 MG PO TABS
500.0000 mg | ORAL_TABLET | Freq: Every day | ORAL | Status: DC
  Administered 2021-03-22: 500 mg via ORAL
  Filled 2021-03-22: qty 1

## 2021-03-22 MED ORDER — LAMOTRIGINE 25 MG PO TABS
50.0000 mg | ORAL_TABLET | Freq: Every day | ORAL | Status: DC
  Administered 2021-03-22: 50 mg via ORAL
  Filled 2021-03-22: qty 2

## 2021-03-22 MED ORDER — OLANZAPINE 5 MG PO TABS
7.5000 mg | ORAL_TABLET | Freq: Every day | ORAL | Status: DC
  Administered 2021-03-22: 7.5 mg via ORAL
  Filled 2021-03-22: qty 2

## 2021-03-22 MED ORDER — LAMOTRIGINE 25 MG PO TABS
25.0000 mg | ORAL_TABLET | Freq: Every day | ORAL | Status: DC
  Administered 2021-03-23: 25 mg via ORAL
  Filled 2021-03-22: qty 1

## 2021-03-22 MED ORDER — LORAZEPAM 0.5 MG PO TABS: 0.5000 mg | ORAL_TABLET | Freq: Three times a day (TID) | ORAL | Status: DC | PRN

## 2021-03-22 MED ORDER — MELATONIN 5 MG PO TABS: 5.0000 mg | ORAL_TABLET | Freq: Every day | ORAL | Status: DC | PRN

## 2021-03-22 MED ORDER — LAMOTRIGINE 25 MG PO TABS: 25.0000 mg | ORAL_TABLET | ORAL | Status: DC

## 2021-03-22 NOTE — Evaluation (Signed)
Clinical/Bedside Swallow Evaluation Patient Details  Name: Hunter Chavez MRN: [id number] Date of Birth: 10/16/1950  Today's Date: 03/22/2021 Time: SLP Start Time (ACUTE ONLY): 1045 SLP Stop Time (ACUTE ONLY): 1127 SLP Time Calculation (min) (ACUTE ONLY): 42 min  Past Medical History:  Past Medical History:  Diagnosis Date   Anxiety    Dementia (HCC)    Hypertension    Learning disorder    Seizures (HCC)    Past Surgical History:  Past Surgical History:  Procedure Laterality Date   HIP SURGERY     HPI:  Pt is a 70 yo male adm to [facility] with hypoxia after suspected aspiration event while consuming gatorade.  Pt with PMH + for dementia, intellectual disability, seizures.  Pt was found to have right lower lobe pna on xray.  Pt has had prior MBS a year ago when he aspirated a hot dog.  At that time, diet recommendation was dys2/thin with strict compliance.  Sister, Hilda Lias, pt's caregiver, present and reports she feed the patient his meals due to his rapid rate of intake.  She suspects pt had not cleared food from his mouth when he aspirated gatorade.   Pt has not had weight loss, recurrent pnas nor required heimilch manuever since last admit = 10/2019.  Swallow evaluation ordered.    Assessment / Plan / Recommendation  Clinical Impression  Pt alert and willing to consume po.  He tilts his head down and to the right - which his sister states is normal.   Presented pt with water, nectar thick juice, applesauce and graham cracker boluses.   No indication of aspiration noted - and overally timely swallow - most notably with liquids.  Vertical mastication pattern apparent - (also based on review of prior MBS) with adequate - albeit delayed clearance.  Wet eructation noted after snack - pt with h/o reflux requiring reflux medications at least since 2008 per sister.  Recommend strict reflux and aspiration precautions.  Pt will need to be fed if family is not present - assuring oral clearance before giving  more.  Cira Servant to mitigation of aspiration instructions and other risk factors for aspiration pna.  Chin tuck posture naturally produced by pt - and this position pt tested under on last MBS- thus recommend to continue to encourage chin tuck.  Reviewed prior MBS loops with pt's sister Hilda Lias. Pt will be chronic aspiration risk due to his dysphagia but had been managing (since last year 10/2019) until currently. SLP Visit Diagnosis: Dysphagia, oral phase (R13.11)    Aspiration Risk  Mild aspiration risk    Diet Recommendation Dysphagia 3 (Mech soft);Thin liquid   Liquid Administration via: Straw Medication Administration: Whole meds with puree Supervision: Full supervision/cueing for compensatory strategies;Staff to assist with self feeding Compensations: Slow rate;Small sips/bites;Chin tuck Postural Changes: Seated upright at 90 degrees;Remain upright for at least 30 minutes after po intake    Other  Recommendations Oral Care Recommendations: Oral care QID Other Recommendations: Have oral suction available    Recommendations for follow up therapy are one component of a multi-disciplinary discharge planning process, led by the attending physician.  Recommendations may be updated based on patient status, additional functional criteria and insurance authorization.  Follow up Recommendations No SLP follow up      Assistance Recommended at Discharge Frequent or constant Supervision/Assistance  Functional Status Assessment Patient has had a recent decline in their functional status and demonstrates the ability to make significant improvements in function in a reasonable and predictable  amount of time.  Frequency and Duration min 1 x/week  1 week       Prognosis Prognosis for Safe Diet Advancement: Good Barriers to Reach Goals: Cognitive deficits;Time post onset      Swallow Study   General Date of Onset: 03/22/21 HPI: Pt is a 70 yo male adm to [facility] with hypoxia after suspected  aspiration event while consuming gatorade.  Pt with PMH + for dementia, intellectual disability, seizures.  Pt was found to have right lower lobe pna on xray.  Pt has had prior MBS a year ago when he aspirated a hot dog.  At that time, diet recommendation was dys2/thin with strict compliance.  Sister, Hilda Lias, pt's caregiver, present and reports she feed the patient his meals due to his rapid rate of intake.  She suspects pt had not cleared food from his mouth when he aspirated gatorade.   Pt has not had weight loss, recurrent pnas nor required heimilch manuever since last admit = 10/2019.  Swallow evaluation ordered. Type of Study: Bedside Swallow Evaluation Previous Swallow Assessment: MBS 10/2019 Diet Prior to this Study: NPO Temperature Spikes Noted: No Respiratory Status: Nasal cannula (2) History of Recent Intubation: No Behavior/Cognition: Alert (inconsistently follows directions) Oral Cavity Assessment: Excessive secretions Oral Care Completed by SLP: Yes Oral Cavity - Dentition: Other (Comment);Adequate natural dentition (some upper dentition missing) Self-Feeding Abilities: Total assist Patient Positioning: Upright in bed Baseline Vocal Quality: Other (comment);Low vocal intensity Volitional Cough: Weak Volitional Swallow: Unable to elicit    Oral/Motor/Sensory Function Overall Oral Motor/Sensory Function: Generalized oral weakness   Ice Chips Ice chips: Not tested   Thin Liquid Thin Liquid: Within functional limits Presentation: Spoon;Straw    Nectar Thick Nectar Thick Liquid: Within functional limits Presentation: Spoon;Straw   Honey Thick Honey Thick Liquid: Not tested   Puree Puree: Within functional limits Presentation: Spoon   Solid     Solid: Impaired Oral Phase Impairments: Impaired mastication (prolonged oral mastication presumed from review of prior mbs) Oral Phase Functional Implications: Impaired mastication;Prolonged oral transit      Chales Abrahams 03/22/2021,12:24 PM Rolena Infante, MS [facility] SLP Acute Rehab Services Office [phone] Pager [phone]

## 2021-03-22 NOTE — Progress Notes (Signed)
  Progress Note    Hunter Chavez   AST:[id number]  DOB: 1950-06-03  DOA: 03/21/2021     0 Date of Service: 03/22/2021   Clinical Course Hunter Chavez is a 70 y.o. male with medical history significant for HTN, seizures, dementia, cognitive delay and does not communicate well who presents for evaluation of shortness of breath with coughing.  Symptoms began in the afternoon when he was eating.  He lives with his sister and brother-in-law who are his POA.  They cut up his food and feed him.  After he ate as he normally does he wanted some Gatorade while he was drinking through a straw he began to cough and he had Gatorade come out of his mouth and nose according to the brother-in-law who witnessed it.  He then had persistent cough for the next hour and seem to become more short of breath so was brought to the emergency room.  He had no other complaints did not have fever he did not have any vomiting or diarrhea according to the brother-in-law.  Found to be hypoxic on room air and required oxygen by nasal cannula.  Does not use oxygen at home.  Clinical presentation it was suspected he has aspiration pneumonia and he was started on antibiotic with Rocephin and azithromycin in the emergency room He does not use tobacco alcohol or illicit drugs according to the brother-in-law  Assessment and Plan Aspiration pneumonia-sepsis - Right lower lobe infiltrate noted on chest x-ray today -Pulse ox was 85% on room air while in the ED-today he has been weaned off of oxygen -WBC count is 18.4 today-follow for improvement - Continue Unasyn-has mild rhonchi and a cough today-continues to have a respiratory rate in the 20s-heart rate has improved but he did have a low-grade temp of 100 last night again -He has been assessed by SLP and recommended to be on a mechanical soft diet with thin liquids  Lactic acidosis - Due to above - Lactic acid of 3.1 has improved to 1.2  AKI - Creatinine was 1.52 and has  improved to 1.33 today -Discontinue IV fluids and follow oral intake today  Subjective:  He has no complaints  Objective Vitals:   03/22/21 0830 03/22/21 1030 03/22/21 1140 03/22/21 1432  BP: 127/64  139/61 (!) 151/93  Pulse: 68   63  Resp: 16   17  Temp: 98.9 F (37.2 C)   (!) 97.5 F (36.4 C)  TempSrc: Oral   Oral  SpO2:  98%  92%  Weight:      Height:       62.6 kg     Exam Physical Exam General exam: Appears comfortable-does not attempt to communicate with me or follow commands HEENT: PERRLA, oral mucosa moist, no sclera icterus or thrush Respiratory system: Bilateral rhonchi noted respiratory rate is elevated Cardiovascular system: S1 & S2 heard, regular rate and rhythm Gastrointestinal system: Abdomen soft, non-tender, nondistended. Normal bowel sounds   Central nervous system: Alert - No focal neurological deficits. Extremities: No cyanosis, clubbing or edema Skin: No rashes or ulcers   Labs / Other Information Pertinent labs noted above in note   Disposition Plan: Status is: Inpatient  Remains inpatient appropriate because: Aspiration pneumonia-IV antibiotics       Time spent: 35 minutes [facility] 03/22/2021, 5:26 PM

## 2021-03-23 DIAGNOSIS — N179 Acute kidney failure, unspecified: Secondary | ICD-10-CM

## 2021-03-23 LAB — CBC
HCT: 40.8 % (ref 39.0–52.0)
Hemoglobin: 12.8 g/dL — ABNORMAL LOW (ref 13.0–17.0)
MCH: 27.4 pg (ref 26.0–34.0)
MCHC: 31.4 g/dL (ref 30.0–36.0)
MCV: 87.2 fL (ref 80.0–100.0)
Platelets: 145 10*3/uL — ABNORMAL LOW (ref 150–400)
RBC: 4.68 MIL/uL (ref 4.22–5.81)
RDW: 12.2 % (ref 11.5–15.5)
WBC: 10.3 10*3/uL (ref 4.0–10.5)
nRBC: 0 % (ref 0.0–0.2)

## 2021-03-23 LAB — BASIC METABOLIC PANEL
Anion gap: 5 (ref 5–15)
BUN: 10 mg/dL (ref 8–23)
CO2: 30 mmol/L (ref 22–32)
Calcium: 8.8 mg/dL — ABNORMAL LOW (ref 8.9–10.3)
Chloride: 104 mmol/L (ref 98–111)
Creatinine, Ser: 0.93 mg/dL (ref 0.61–1.24)
GFR, Estimated: >60 mL/min (ref >60)
Glucose, Bld: 97 mg/dL (ref 70–99)
Potassium: 3.5 mmol/L (ref 3.5–5.1)
Sodium: 139 mmol/L (ref 135–145)

## 2021-03-23 MED ORDER — AMOXICILLIN-POT CLAVULANATE 875-125 MG PO TABS: 1.0000 | ORAL_TABLET | Freq: Two times a day (BID) | ORAL | 0 refills | Status: AC

## 2021-03-23 MED ORDER — AMOXICILLIN-POT CLAVULANATE 875-125 MG PO TABS: 1.0000 | ORAL_TABLET | Freq: Two times a day (BID) | ORAL | 0 refills | Status: DC

## 2021-03-23 MED ORDER — ORAL CARE MOUTH RINSE
15.0000 mL | Freq: Two times a day (BID) | OROMUCOSAL | Status: DC
  Administered 2021-03-23: 15 mL via OROMUCOSAL

## 2021-03-23 NOTE — Discharge Summary (Signed)
none Physician Discharge Summary   Patient name: Hunter Chavez  Admit date:     03/21/2021  Discharge date: 03/25/2021  Discharge Physician: Calvert Cantor   PCP: Angelica Chessman, MD   Recommendations at discharge: none  Discharge Diagnoses Principal Problem:   Aspiration pneumonia [facility]) Active Problems:   Dementia (HCC)   Essential hypertension   Hypoxemia   AKI (acute kidney injury) [facility])    Clinical Course Hunter Chavez is a 70 y.o. male with medical history significant for HTN, seizures, dementia, cognitive delay and does not communicate well who presents for evaluation of shortness of breath with coughing.  Symptoms began in the afternoon when he was eating.  He lives with his sister and brother-in-law who are his POA.  They cut up his food and feed him.  After he ate as he normally does he wanted some Gatorade while he was drinking through a straw he began to cough and he had Gatorade come out of his mouth and nose according to the brother-in-law who witnessed it.  He then had persistent cough for the next hour and seem to become more short of breath so was brought to the emergency room.  He had no other complaints did not have fever he did not have any vomiting or diarrhea according to the brother-in-law.  Found to be hypoxic on room air and required oxygen by nasal cannula.  Does not use oxygen at home.  Clinical presentation it was suspected he has aspiration pneumonia and he was started on antibiotic with Rocephin and azithromycin in the emergency room He does not use tobacco alcohol or illicit drugs according to the brother-in-law   Assessment and Plan Aspiration pneumonia-sepsis - Right lower lobe infiltrate noted on chest x-ray today -Pulse ox was 85% on room air while in the ED- he has been weaned off of oxygen -WBC count 18.4 > 10.3 - fever and tachypnea have now resolved -He has been assessed by SLP and recommended to be on a mechanical soft diet with thin liquids -  treated with Unasyn in the hospital and is being discharged on Augmentin  Mild Thrombocytopenia - ? If due to acute infection - platelets are ~ 145   Lactic acidosis - Due to above - Lactic acid of 3.1 has improved to 1.2   AKI - Creatinine was 1.52, improved to 1.33 and then 0.93     Procedures performed: none   Condition at discharge: good  Exam Physical Exam General exam: Appears comfortable  HEENT: PERRLA, oral mucosa moist, no sclera icterus or thrush Respiratory system: Clear to auscultation. Respiratory effort normal. Cardiovascular system: S1 & S2 heard, regular rate and rhythm Gastrointestinal system: Abdomen soft, non-tender, nondistended. Normal bowel sounds   Central nervous system: Alert and oriented. No focal neurological deficits. Extremities: No cyanosis, clubbing or edema Skin: No rashes or ulcers Psychiatry:  Mood & affect appropriate.    Disposition: Home  Discharge time: greater than 30 minutes.   Allergies as of 03/23/2021   No Known Allergies      Medication List     TAKE these medications    amoxicillin-clavulanate 875-125 MG tablet Commonly known as: Augmentin Take 1 tablet by mouth every 12 (twelve) hours for 5 days.   ascorbic acid 500 MG tablet Commonly known as: VITAMIN C Take 500 mg by mouth at bedtime.   brimonidine 0.2 % ophthalmic solution Commonly known as: ALPHAGAN Place 1 drop into both eyes 3 (three) times daily.   carbonyl iron  45 MG Tabs tablet Commonly known as: FEOSOL Take 45 mg by mouth daily.   Cholecalciferol 50 MCG (2000 UT) Caps Take 2,000 Units by mouth daily.   diltiazem 240 MG 24 hr capsule Commonly known as: CARDIZEM CD Take 240 mg by mouth daily.   donepezil 10 MG tablet Commonly known as: ARICEPT Take 20 mg by mouth daily.   dorzolamide-timolol 22.3-6.8 MG/ML ophthalmic solution Commonly known as: COSOPT Place 1 drop into both eyes 2 (two) times daily.   famotidine 10 MG tablet Commonly  known as: PEPCID Take 10 mg by mouth daily.   lamoTRIgine 25 MG tablet Commonly known as: LAMICTAL Take 25-50 mg by mouth See admin instructions. Taking 25 mg in the AM and 50 mg at bedtime   latanoprost 0.005 % ophthalmic solution Commonly known as: XALATAN Place 1 drop into both eyes at bedtime.   levETIRAcetam 500 MG tablet Commonly known as: KEPPRA Take 500 mg by mouth at bedtime.   LORazepam 0.5 MG tablet Commonly known as: ATIVAN Take 0.5 mg by mouth every 8 (eight) hours as needed for anxiety.   melatonin 5 MG Tabs Take 5 mg by mouth daily as needed (to relax).   OLANZapine 7.5 MG tablet Commonly known as: ZYPREXA Take 7.5 mg by mouth at bedtime.   pravastatin 20 MG tablet Commonly known as: PRAVACHOL Take 20 mg by mouth daily.        X-ray chest PA and lateral  Result Date: 03/22/2021 CLINICAL DATA:  Encounter for pneumonia, aspiration. EXAM: CHEST - 2 VIEW COMPARISON:  03/21/2021 FINDINGS: Cardiomegaly. Focal airspace opacity in the right lower lobe. Left lung clear. No effusions or overt edema. No acute bony abnormality. IMPRESSION: Focal opacity in the right lower lobe concerning for pneumonia. Cardiomegaly. Electronically Signed   By: Charlett Nose M.D.   On: 03/22/2021 09:44   DG Chest Portable 1 View  Result Date: 03/21/2021 CLINICAL DATA:  Hypoxia, history of aspiration pneumonia EXAM: PORTABLE CHEST 1 VIEW COMPARISON:  Chest radiograph 11/25/2019 FINDINGS: The heart is mildly enlarged, unchanged. There is no focal consolidation or pulmonary edema. There is no pleural effusion or pneumothorax. There is unchanged scoliotic curvature of the upper thoracic spine. There is no acute osseous abnormality. IMPRESSION: No radiographic evidence of acute cardiopulmonary process. Electronically Signed   By: Lesia Hausen M.D.   On: 03/21/2021 15:49   Results for orders placed or performed during the hospital encounter of 03/21/21  Blood culture (routine x 2)     Status:  None (Preliminary result)   Collection Time: 03/21/21  3:30 PM   Specimen: BLOOD RIGHT ARM  Result Value Ref Range Status   Specimen Description   Final    BLOOD RIGHT ARM Performed at [facility], [address] [place], [place] [address]    Special Requests   Final    BOTTLES DRAWN AEROBIC AND ANAEROBIC Blood Culture adequate volume Performed at [facility], [address] [place], [place] [address]    Culture   Final    NO GROWTH 4 DAYS Performed at [facility], [address] [place], [place] [address]    Report Status PENDING  Incomplete  Resp Panel by RT-PCR (Flu A&B, Covid) Nasopharyngeal Swab     Status: None   Collection Time: 03/21/21  3:56 PM   Specimen: Nasopharyngeal Swab; Nasopharyngeal(NP) swabs in vial transport medium  Result Value Ref Range Status   SARS Coronavirus 2 by RT PCR NEGATIVE  NEGATIVE Final    Comment: (NOTE) SARS-CoV-2 target nucleic acids are NOT DETECTED.  The SARS-CoV-2 RNA is generally detectable in upper respiratory specimens during the acute phase of infection. The lowest concentration of SARS-CoV-2 viral copies this assay can detect is 138 copies/mL. A negative result does not preclude SARS-Cov-2 infection and should not be used as the sole basis for treatment or other patient management decisions. A negative result may occur with  improper specimen collection/handling, submission of specimen other than nasopharyngeal swab, presence of viral mutation(s) within the areas targeted by this assay, and inadequate number of viral copies(<138 copies/mL). A negative result must be combined with clinical observations, patient history, and epidemiological information. The expected result is Negative.  Fact Sheet for Patients:  BloggerCourse.com  Fact Sheet for Healthcare Providers:  SeriousBroker.it  This test is no t yet approved or cleared by the [place] FDA and   has been authorized for detection and/or diagnosis of SARS-CoV-2 by FDA under an Emergency Use Authorization (EUA). This EUA will remain  in effect (meaning this test can be used) for the duration of the COVID-19 declaration under Section 564(b)(1) of the Act, 21 U.S.C.section 360bbb-3(b)(1), unless the authorization is terminated  or revoked sooner.       Influenza A by PCR NEGATIVE NEGATIVE Final   Influenza B by PCR NEGATIVE NEGATIVE Final    Comment: (NOTE) The Xpert Xpress SARS-CoV-2/FLU/RSV plus assay is intended as an aid in the diagnosis of influenza from Nasopharyngeal swab specimens and should not be used as a sole basis for treatment. Nasal washings and aspirates are unacceptable for Xpert Xpress SARS-CoV-2/FLU/RSV testing.  Fact Sheet for Patients: BloggerCourse.com  Fact Sheet for Healthcare Providers: SeriousBroker.it  This test is not yet approved or cleared by the [place] FDA and has been authorized for detection and/or diagnosis of SARS-CoV-2 by FDA under an Emergency Use Authorization (EUA). This EUA will remain in effect (meaning this test can be used) for the duration of the COVID-19 declaration under Section 564(b)(1) of the Act, 21 U.S.C. section 360bbb-3(b)(1), unless the authorization is terminated or revoked.  Performed at [facility], [address] [place], [place] [address]   Blood culture (routine x 2)     Status: None (Preliminary result)   Collection Time: 03/21/21  4:08 PM   Specimen: BLOOD  Result Value Ref Range Status   Specimen Description   Final    BLOOD LEFT ANTECUBITAL Performed at [facility], [address] [place], [place] [address]    Special Requests   Final    BOTTLES DRAWN AEROBIC AND ANAEROBIC Blood Culture adequate volume Performed at [facility], [address] [place], [place] [address]    Culture   Final    NO GROWTH 4  DAYS Performed at [facility], [address] [place], [place] [address]    Report Status PENDING  Incomplete    Signed:  Calvert Cantor MD.  [facility] 03/25/2021, 4:56 PM

## 2021-03-23 NOTE — Plan of Care (Signed)

## 2021-03-26 LAB — CULTURE, BLOOD (ROUTINE X 2)
Culture: NO GROWTH
Culture: NO GROWTH
Special Requests: ADEQUATE
Special Requests: ADEQUATE

## 2021-09-01 ENCOUNTER — Encounter (HOSPITAL_BASED_OUTPATIENT_CLINIC_OR_DEPARTMENT_OTHER): Payer: Self-pay | Admitting: Emergency Medicine

## 2021-09-01 ENCOUNTER — Emergency Department (HOSPITAL_BASED_OUTPATIENT_CLINIC_OR_DEPARTMENT_OTHER): Payer: Medicare Other

## 2021-09-01 ENCOUNTER — Inpatient Hospital Stay (HOSPITAL_BASED_OUTPATIENT_CLINIC_OR_DEPARTMENT_OTHER)
Admission: EM | Admit: 2021-09-01 | Discharge: 2021-09-03 | DRG: 069 | Disposition: A | Discharge status: Home / Self Care | Payer: Medicare Other | Attending: Internal Medicine | Admitting: Internal Medicine

## 2021-09-01 ENCOUNTER — Other Ambulatory Visit: Payer: Self-pay

## 2021-09-01 DIAGNOSIS — R2990 Unspecified symptoms and signs involving the nervous system: Secondary | ICD-10-CM | POA: Diagnosis present

## 2021-09-01 DIAGNOSIS — Z7982 Long term (current) use of aspirin: Secondary | ICD-10-CM

## 2021-09-01 DIAGNOSIS — F819 Developmental disorder of scholastic skills, unspecified: Secondary | ICD-10-CM | POA: Diagnosis present

## 2021-09-01 DIAGNOSIS — S0232XD Fracture of orbital floor, left side, subsequent encounter for fracture with routine healing: Secondary | ICD-10-CM

## 2021-09-01 DIAGNOSIS — R6250 Unspecified lack of expected normal physiological development in childhood: Secondary | ICD-10-CM | POA: Diagnosis present

## 2021-09-01 DIAGNOSIS — G459 Transient cerebral ischemic attack, unspecified: Secondary | ICD-10-CM | POA: Diagnosis not present

## 2021-09-01 DIAGNOSIS — K219 Gastro-esophageal reflux disease without esophagitis: Secondary | ICD-10-CM | POA: Diagnosis present

## 2021-09-01 DIAGNOSIS — I440 Atrioventricular block, first degree: Secondary | ICD-10-CM | POA: Diagnosis present

## 2021-09-01 DIAGNOSIS — I10 Essential (primary) hypertension: Secondary | ICD-10-CM | POA: Diagnosis present

## 2021-09-01 DIAGNOSIS — Z833 Family history of diabetes mellitus: Secondary | ICD-10-CM

## 2021-09-01 DIAGNOSIS — E785 Hyperlipidemia, unspecified: Secondary | ICD-10-CM | POA: Diagnosis present

## 2021-09-01 DIAGNOSIS — F0284 Dementia in other diseases classified elsewhere, unspecified severity, with anxiety: Secondary | ICD-10-CM | POA: Diagnosis present

## 2021-09-01 DIAGNOSIS — G40909 Epilepsy, unspecified, not intractable, without status epilepticus: Secondary | ICD-10-CM | POA: Diagnosis present

## 2021-09-01 DIAGNOSIS — F02811 Dementia in other diseases classified elsewhere, unspecified severity, with agitation: Secondary | ICD-10-CM | POA: Diagnosis present

## 2021-09-01 DIAGNOSIS — H548 Legal blindness, as defined in USA: Secondary | ICD-10-CM | POA: Diagnosis present

## 2021-09-01 DIAGNOSIS — F840 Autistic disorder: Secondary | ICD-10-CM | POA: Diagnosis present

## 2021-09-01 DIAGNOSIS — G8191 Hemiplegia, unspecified affecting right dominant side: Secondary | ICD-10-CM | POA: Diagnosis present

## 2021-09-01 DIAGNOSIS — N179 Acute kidney failure, unspecified: Secondary | ICD-10-CM | POA: Diagnosis not present

## 2021-09-01 DIAGNOSIS — Z20822 Contact with and (suspected) exposure to covid-19: Secondary | ICD-10-CM | POA: Diagnosis present

## 2021-09-01 DIAGNOSIS — I351 Nonrheumatic aortic (valve) insufficiency: Secondary | ICD-10-CM | POA: Diagnosis present

## 2021-09-01 DIAGNOSIS — E876 Hypokalemia: Secondary | ICD-10-CM | POA: Diagnosis present

## 2021-09-01 DIAGNOSIS — Z8249 Family history of ischemic heart disease and other diseases of the circulatory system: Secondary | ICD-10-CM

## 2021-09-01 DIAGNOSIS — F419 Anxiety disorder, unspecified: Secondary | ICD-10-CM | POA: Diagnosis not present

## 2021-09-01 DIAGNOSIS — R29810 Facial weakness: Secondary | ICD-10-CM | POA: Diagnosis present

## 2021-09-01 DIAGNOSIS — R001 Bradycardia, unspecified: Secondary | ICD-10-CM | POA: Diagnosis present

## 2021-09-01 DIAGNOSIS — R778 Other specified abnormalities of plasma proteins: Secondary | ICD-10-CM | POA: Diagnosis present

## 2021-09-01 DIAGNOSIS — R569 Unspecified convulsions: Secondary | ICD-10-CM

## 2021-09-01 DIAGNOSIS — R531 Weakness: Principal | ICD-10-CM

## 2021-09-01 DIAGNOSIS — Z7902 Long term (current) use of antithrombotics/antiplatelets: Secondary | ICD-10-CM

## 2021-09-01 DIAGNOSIS — I248 Other forms of acute ischemic heart disease: Secondary | ICD-10-CM | POA: Diagnosis present

## 2021-09-01 LAB — URINALYSIS, ROUTINE W REFLEX MICROSCOPIC
Bilirubin Urine: NEGATIVE
Glucose, UA: NEGATIVE mg/dL
Hgb urine dipstick: NEGATIVE
Ketones, ur: NEGATIVE mg/dL
Leukocytes,Ua: NEGATIVE
Nitrite: NEGATIVE
Protein, ur: NEGATIVE mg/dL
Specific Gravity, Urine: 1.02 (ref 1.005–1.030)
pH: 7.5 (ref 5.0–8.0)

## 2021-09-01 LAB — BLOOD GAS, VENOUS
Acid-Base Excess: 8.7 mmol/L — ABNORMAL HIGH (ref 0.0–2.0)
Bicarbonate: 35 mmol/L — ABNORMAL HIGH (ref 20.0–28.0)
Drawn by: 6125
O2 Saturation: 86.6 %
Patient temperature: 36.5
pCO2, Ven: 53 mmHg (ref 44–60)
pH, Ven: 7.43 (ref 7.25–7.43)
pO2, Ven: 55 mmHg — ABNORMAL HIGH (ref 32–45)

## 2021-09-01 LAB — I-STAT VENOUS BLOOD GAS, ED
Acid-Base Excess: 7 mmol/L — ABNORMAL HIGH (ref 0.0–2.0)
Bicarbonate: 33.4 mmol/L — ABNORMAL HIGH (ref 20.0–28.0)
Calcium, Ion: 1.26 mmol/L (ref 1.15–1.40)
HCT: 39 % (ref 39.0–52.0)
Hemoglobin: 13.3 g/dL (ref 13.0–17.0)
O2 Saturation: 91 %
Potassium: 3.3 mmol/L — ABNORMAL LOW (ref 3.5–5.1)
Sodium: 143 mmol/L (ref 135–145)
TCO2: 35 mmol/L — ABNORMAL HIGH (ref 22–32)
pCO2, Ven: 53 mmHg (ref 44–60)
pH, Ven: 7.408 (ref 7.25–7.43)
pO2, Ven: 61 mmHg — ABNORMAL HIGH (ref 32–45)

## 2021-09-01 LAB — CBC WITH DIFFERENTIAL/PLATELET
Abs Immature Granulocytes: 0.01 10*3/uL (ref 0.00–0.07)
Basophils Absolute: 0 10*3/uL (ref 0.0–0.1)
Basophils Relative: 0 %
Eosinophils Absolute: 0.1 10*3/uL (ref 0.0–0.5)
Eosinophils Relative: 3 %
HCT: 39.7 % (ref 39.0–52.0)
Hemoglobin: 12.7 g/dL — ABNORMAL LOW (ref 13.0–17.0)
Immature Granulocytes: 0 %
Lymphocytes Relative: 47 %
Lymphs Abs: 2 10*3/uL (ref 0.7–4.0)
MCH: 26.5 pg (ref 26.0–34.0)
MCHC: 32 g/dL (ref 30.0–36.0)
MCV: 82.9 fL (ref 80.0–100.0)
Monocytes Absolute: 0.5 10*3/uL (ref 0.1–1.0)
Monocytes Relative: 12 %
Neutro Abs: 1.6 10*3/uL — ABNORMAL LOW (ref 1.7–7.7)
Neutrophils Relative %: 38 %
Platelets: 183 10*3/uL (ref 150–400)
RBC: 4.79 MIL/uL (ref 4.22–5.81)
RDW: 12.8 % (ref 11.5–15.5)
WBC: 4.3 10*3/uL (ref 4.0–10.5)
nRBC: 0 % (ref 0.0–0.2)

## 2021-09-01 LAB — COMPREHENSIVE METABOLIC PANEL
ALT: 11 U/L (ref 0–44)
AST: 14 U/L — ABNORMAL LOW (ref 15–41)
Albumin: 3.8 g/dL (ref 3.5–5.0)
Alkaline Phosphatase: 83 U/L (ref 38–126)
Anion gap: 9 (ref 5–15)
BUN: 10 mg/dL (ref 8–23)
CO2: 30 mmol/L (ref 22–32)
Calcium: 9.4 mg/dL (ref 8.9–10.3)
Chloride: 104 mmol/L (ref 98–111)
Creatinine, Ser: 1.14 mg/dL (ref 0.61–1.24)
GFR, Estimated: >60 mL/min (ref >60)
Glucose, Bld: 93 mg/dL (ref 70–99)
Potassium: 3.4 mmol/L — ABNORMAL LOW (ref 3.5–5.1)
Sodium: 143 mmol/L (ref 135–145)
Total Bilirubin: 0.6 mg/dL (ref 0.3–1.2)
Total Protein: 6.9 g/dL (ref 6.5–8.1)

## 2021-09-01 LAB — LIPASE, BLOOD: Lipase: 41 U/L (ref 11–51)

## 2021-09-01 LAB — RESP PANEL BY RT-PCR (FLU A&B, COVID) ARPGX2
Influenza A by PCR: NEGATIVE
Influenza B by PCR: NEGATIVE
SARS Coronavirus 2 by RT PCR: NEGATIVE

## 2021-09-01 LAB — RAPID URINE DRUG SCREEN, HOSP PERFORMED
Amphetamines: NOT DETECTED
Barbiturates: NOT DETECTED
Benzodiazepines: NOT DETECTED
Cocaine: NOT DETECTED
Opiates: NOT DETECTED
Tetrahydrocannabinol: NOT DETECTED

## 2021-09-01 LAB — TROPONIN I (HIGH SENSITIVITY)
Troponin I (High Sensitivity): 31 ng/L — ABNORMAL HIGH (ref <18)
Troponin I (High Sensitivity): 30 ng/L — ABNORMAL HIGH (ref <18)

## 2021-09-01 LAB — CBG MONITORING, ED: Glucose-Capillary: 91 mg/dL (ref 70–99)

## 2021-09-01 MED ORDER — HYDRALAZINE HCL 20 MG/ML IJ SOLN: 10.0000 mg | INTRAMUSCULAR | Status: DC | PRN

## 2021-09-01 MED ORDER — LEVETIRACETAM 500 MG PO TABS
500.0000 mg | ORAL_TABLET | Freq: Every day | ORAL | Status: DC
  Administered 2021-09-01 – 2021-09-02 (×2): 500 mg via ORAL
  Filled 2021-09-01 (×2): qty 1

## 2021-09-01 MED ORDER — LORAZEPAM 0.5 MG PO TABS
0.5000 mg | ORAL_TABLET | Freq: Three times a day (TID) | ORAL | Status: DC | PRN
  Administered 2021-09-02: 0.5 mg via ORAL
  Filled 2021-09-01: qty 1

## 2021-09-01 MED ORDER — MELATONIN 5 MG PO TABS: 5.0000 mg | ORAL_TABLET | Freq: Every evening | ORAL | Status: DC | PRN

## 2021-09-01 MED ORDER — ACETAMINOPHEN 650 MG RE SUPP
650.0000 mg | Freq: Four times a day (QID) | RECTAL | Status: DC | PRN
Start: 2021-09-01 — End: 2021-09-03

## 2021-09-01 MED ORDER — POTASSIUM CHLORIDE 10 MEQ/100ML IV SOLN
10.0000 meq | INTRAVENOUS | Status: AC
  Administered 2021-09-01 – 2021-09-02 (×4): 10 meq via INTRAVENOUS
  Filled 2021-09-01 (×4): qty 100

## 2021-09-01 MED ORDER — PRAVASTATIN SODIUM 10 MG PO TABS
20.0000 mg | ORAL_TABLET | Freq: Every day | ORAL | Status: DC
  Administered 2021-09-02: 20 mg via ORAL
  Filled 2021-09-01: qty 2

## 2021-09-01 MED ORDER — IOHEXOL 350 MG/ML SOLN
75.0000 mL | Freq: Once | INTRAVENOUS | Status: AC | PRN
  Administered 2021-09-01: 100 mL via INTRAVENOUS

## 2021-09-01 MED ORDER — BRIMONIDINE TARTRATE 0.2 % OP SOLN
1.0000 [drp] | Freq: Three times a day (TID) | OPHTHALMIC | Status: DC
  Administered 2021-09-01 – 2021-09-03 (×5): 1 [drp] via OPHTHALMIC
  Filled 2021-09-01: qty 5

## 2021-09-01 MED ORDER — STROKE: EARLY STAGES OF RECOVERY BOOK
Freq: Once | Status: AC
  Filled 2021-09-01: qty 1

## 2021-09-01 MED ORDER — LATANOPROST 0.005 % OP SOLN
1.0000 [drp] | Freq: Every day | OPHTHALMIC | Status: DC
  Administered 2021-09-01 – 2021-09-02 (×2): 1 [drp] via OPHTHALMIC
  Filled 2021-09-01: qty 2.5

## 2021-09-01 MED ORDER — LAMOTRIGINE 25 MG PO TABS
25.0000 mg | ORAL_TABLET | Freq: Every day | ORAL | Status: DC
  Administered 2021-09-02 – 2021-09-03 (×2): 25 mg via ORAL
  Filled 2021-09-01 (×2): qty 1

## 2021-09-01 MED ORDER — LAMOTRIGINE 25 MG PO TABS
50.0000 mg | ORAL_TABLET | Freq: Every day | ORAL | Status: DC
  Administered 2021-09-01 – 2021-09-02 (×2): 50 mg via ORAL
  Filled 2021-09-01 (×2): qty 2

## 2021-09-01 MED ORDER — LAMOTRIGINE 25 MG PO TABS: 25.0000 mg | ORAL_TABLET | ORAL | Status: DC

## 2021-09-01 MED ORDER — FAMOTIDINE 10 MG PO TABS
10.0000 mg | ORAL_TABLET | Freq: Every day | ORAL | Status: DC
  Administered 2021-09-02 – 2021-09-03 (×2): 10 mg via ORAL
  Filled 2021-09-01 (×2): qty 1

## 2021-09-01 MED ORDER — ASPIRIN 325 MG PO TABS
325.0000 mg | ORAL_TABLET | Freq: Once | ORAL | Status: AC
  Administered 2021-09-01: 325 mg via ORAL
  Filled 2021-09-01: qty 1

## 2021-09-01 MED ORDER — OLANZAPINE 2.5 MG PO TABS
7.5000 mg | ORAL_TABLET | Freq: Every day | ORAL | Status: DC
Start: 2021-09-01 — End: 2021-09-03
  Administered 2021-09-01 – 2021-09-02 (×2): 7.5 mg via ORAL
  Filled 2021-09-01 (×2): qty 3

## 2021-09-01 MED ORDER — DILTIAZEM HCL ER COATED BEADS 240 MG PO CP24: 240.0000 mg | ORAL_CAPSULE | Freq: Every day | ORAL | Status: DC

## 2021-09-01 MED ORDER — ACETAMINOPHEN 325 MG PO TABS: 650.0000 mg | ORAL_TABLET | Freq: Four times a day (QID) | ORAL | Status: DC | PRN

## 2021-09-01 MED ORDER — DONEPEZIL HCL 10 MG PO TABS
20.0000 mg | ORAL_TABLET | Freq: Every day | ORAL | Status: DC
  Administered 2021-09-02 – 2021-09-03 (×2): 20 mg via ORAL
  Filled 2021-09-01 (×2): qty 2

## 2021-09-01 MED ORDER — DORZOLAMIDE HCL-TIMOLOL MAL 2-0.5 % OP SOLN
1.0000 [drp] | Freq: Two times a day (BID) | OPHTHALMIC | Status: DC
  Administered 2021-09-01 – 2021-09-03 (×4): 1 [drp] via OPHTHALMIC
  Filled 2021-09-01: qty 10

## 2021-09-01 NOTE — ED Triage Notes (Signed)
Per sister , last time was seen last night at 1930, woke this morning , ambulating leaning to right . Hx autism. No falls , non verbal normal to base line .  ?

## 2021-09-01 NOTE — Assessment & Plan Note (Deleted)
? ? ?#)   Hyperlipidemia: documented h/o such. On pravastatin as outpatient.  ? ? ?Plan: continue home statin.  Follow-up result of lipid panel, which has been ordered as component of evaluation for modifiable acute ischemic CVA risk factors in the context of presenting concern for acute stroke, as above. ? ? ?

## 2021-09-01 NOTE — Assessment & Plan Note (Deleted)
? ?#)   Elevated troponin: In the absence of any previously available high sensitive troponin I data points for point comparison, initial high-sensitivity troponin I found to be 31, with repeat value trending down to 30.  The patient denies any recent chest pain or shortness of breath.  Presenting EKG shows nonspecific findings, including nonspecific T wave flattening in leads III and aVF as well as nonspecific T wave inversion in V5/V6, without any evidence of ST changes, including no evidence of ST elevation.  Additionally, chest x-ray shows no evidence of acute cardiopulmonary process, including no evidence of pneumothorax.  Overall, ACS is felt to be less likely based upon the above presentation and preliminary findings.  We will follow closely for result of ensuing echocardiogram, which has been ordered as compared to have further stroke work-up, as above.  ? ?Plan: Echocardiogram in the morning, as above.  Repeat EKG in the morning to continue to trend/monitor nonspecific EKG findings identified on initial EKG.  Monitor on telemetry.  Check serum magnesium level.  Supplementation of mild hypokalemia, as further detailed below.  Full dose aspirin x1 dose now. ? ?

## 2021-09-01 NOTE — ED Notes (Signed)
[facility] into transport 

## 2021-09-01 NOTE — Progress Notes (Signed)
Plan of Care Note for accepted transfer ? ? ?Patient: Hunter Chavez MRN: [id number]   DOA: 09/01/2021 ? ?Facility requesting transfer: [facility] ?Requesting Provider: Wallace Cullens ?Reason for transfer: Stroke evaluation ? ?Facility course: Patient with h/o HTN; dementia; visual impairment; autism; anxiety; and seizures presenting with stroke-like symptoms since 7pm.  CT negative for acute change.  Neurology recommends MRI and stroke work-up.  Troponin 31, 30 - low suspicion for ACS.  Continues to have mild R facial droop and RUE weakness. ? ? ?Plan of care: ?The patient is accepted for admission (observation) to Telemetry unit, at [facility]. ? ? ? ?Author: ?Jonah Blue, MD ?09/01/2021 ? ?Check [email or web address] for on-call coverage. ? ?Nursing staff, Please call [facility] Admits & Consults System-Wide number on Amion as soon as patient's arrival, so appropriate admitting provider can evaluate the pt. ?

## 2021-09-01 NOTE — ED Notes (Signed)
Pt unable to alert staff when he needs to urinate so urinal is in place for Pt to use for urine specimen.  ?

## 2021-09-01 NOTE — H&P (Signed)
?History and Physical  ? ? ?PLEASE NOTE THAT [employer]AGON DICT[employer] SOFTWARE WAS USED IN THE CONSTRUCTION OF THIS NOTE. ? ? ?Hunter Chavez [id number] DOB: 06-15-1950 DOA: 09/01/2021 ? ?PCP: Robyne Peers, MD  ?Patient coming from: home  ? ?I have personally briefly reviewed patient's old medical records in [place] ? ?Chief Complaint: Right-sided facial droop ? ?HPI: Hunter Chavez is a 71 y.o. male with medical history significant for hypertension, hyperlipidemia, developmental delay, dementia, GAD, seizures, legal blindness bilaterally, who is admitted to [facility] on 09/01/2021 by way of transfer from [place] emergency department for further stroke evaluation after presenting from home to the latter facility for evaluation of right-sided facial droop. ? ?In the setting of the patient's developmental delay, limited ability to contribute to the history, the following history is provided by the patient's sister in addition to discussions with EDP, and via chart review. ? ?Patient's sister noted the patient to exhibit new onset right-sided facial droop starting at approximately 1900 on 08/31/2021.  At that time, she noted no additional acute focal neurologic deficits, including no overt evidence of unilateral weakness.  The patient went to bed on the evening of 08/31/2021 with persistence of the aforementioned right-sided facial droop, before awakening on the morning of 09/01/2021, at which time sister noted the patient to be leaning to his right side, representing a change from his baseline posture/stance.  This was in addition to noting persistence of the aforementioned right-sided facial droop.  ? ?The patient has a documented history of legal blindness in his bilateral eyes.  Additionally, in the setting of developmental delay superimposed on dementia, he is  noted, at baseline, to offer one-word responses to questions posed to him.  Relative to this documented baseline, patient/his sister  denies any additional acute focal weakness,, nor any new onset numbness, paresthesias, dysphagia, dizziness, vertigo, nausea, vomiting, dysarthria, or headache.  Additionally, the patient denies any recent chest pain, shortness of breath, palpitations, diaphoresis, presyncope, or syncope. ? ?Medical/social history notable for essential hypertension for which the patient is on diltiazem as well as hyperlipidemia for which she is on pravastatin.  No known history of diabetes, with most recent hemoglobin A1c noted to be 5.7% when checked in July 2021.  Additionally, no known history of atrial fibrillation or obstructive sleep apnea, while the patient is a lifelong non-smoker. ? ?On no antiplatelet or anticoagulation medications as an outpatient. ? ?In the setting of persistence of the patient's right-sided facial droop as well as concern for right-sided weakness, the patient was subsequently brought to [place] emergency department for further evaluation and management thereof.  He was noted to arrive at [facility] ED at 1019 a.m. on 09/01/2021. ? ? ? ? ?[facility] ED Course:  ?Vital signs in the ED were notable for the following: Afebrile; heart rate 48-63; blood pressure 123/48 mmHg; respiratory rate 18-22, oxygen saturation 93 to 94% on room air. ? ?Labs were notable for the following: CBG 91; CMP notable for the following sodium 143, potassium 3.4, creatinine 1.14 compared to most recent prior serum creatinine data point of 0.93 on 03/23/2021, liver enzymes within normal limits.  Initial high-sensitivity troponin I found to be 31, with repeat value trending down to 30 with no prior high-sensitivity troponin I data point available for point of comparison.  CBC notable for white cell count 4300, hemoglobin 12.7 compared to most recent prior value of 12.8 on 03/23/2022.  Urinalysis  showed no white blood cells as well as leukocyte esterase/nitrate negative, negative protein, and  specific gravity 1.020.  Urinary drug screen was pan negative.  COVID-19/influenza PCR were found to be negative. ? ?Imaging and additional notable ED work-up: EKG, in comparison to most recent prior EKG performed on 03/21/2021, showed sinus rhythm with first-degree AV block, PR 213, otherwise, normal intervals; T wave flattening in leads I and aVL, improved relative to T wave inversion observed on most recent prior EKG; T wave flattening in lead II, unchanged from most recent prior; T wave flattening in 3 and aVF, which appear new; T wave inversion in V2 through V6, of which T wave inversion in V2, V3, V4, appear unchanged relative to most recent prior EKG; no evidence of ST changes, including no evidence of ST elevation. ? ?While in the ED, the following were administered: Chest x-ray showed cardiomegaly without evidence of acute cardiopulmonary process, including no evidence of infiltrate, edema, effusion, or pneumothorax.  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  CTA head and neck showed no evidence of large vessel occlusion, nor any evidence of dissection or aneurysm. ? ?[facility] EDP, Dr. Pearline Cables, discussed the patient's case with the on-call neurologist, Dr. Leonel Ramsay, who recommended admission to the hospitalist service at [place] for further evaluation and management of potential stroke, including pursuit of MRI brain.  Neurology to formally consult. ? ?Subsequently, the patient was admitted for overnight observation to med telemetry at [facility] for further stroke evaluation, with presentation also notable for mildly elevated troponin, mild hypokalemia, and mild bradycardia with evidence of first-degree AV block. ? ? ? ? ?Review of Systems: As per HPI otherwise 10 point review of systems  negative.  ? ?Past Medical History:  ?Diagnosis Date  ? Anxiety   ? Dementia ([place])   ? Hypertension   ? Learning disorder   ? Seizures ([place])   ? ? ?Past Surgical History:  ?Procedure Laterality Date  ? HIP SURGERY    ? ? ?Social History: ? reports that he has never smoked. He has never used smokeless tobacco. He reports that he does not drink alcohol and does not use drugs. ? ? ?No Known Allergies ? ?Family History  ?Problem Relation Age of Onset  ? Hypertension Mother   ? Diabetes Mellitus II Mother   ? Hypertension Sister   ? Hypertension Brother   ? ? ?Family history reviewed and not pertinent  ? ? ?Prior to Admission medications   ?Medication Sig Start Date End Date Taking? Authorizing Provider  ?ascorbic acid (VITAMIN C) 500 MG tablet Take 500 mg by mouth at bedtime.    [provider]  ?brimonidine (ALPHAGAN) 0.2 % ophthalmic solution Place 1 drop into both eyes 3 (three) times daily. 10/24/19   [provider]  ?carbonyl iron (FEOSOL) 45 MG TABS tablet Take 45 mg by mouth daily.    [provider]  ?Cholecalciferol 50 MCG (2000 UT) CAPS Take 2,000 Units by mouth daily.    [provider]  ?diltiazem (CARDIZEM CD) 240 MG 24 hr capsule Take 240 mg by mouth daily. 11/02/19   [provider]  ?donepezil (ARICEPT) 10 MG tablet Take 20 mg by mouth daily.     [provider]  ?dorzolamide-timolol (COSOPT) 22.3-6.8 MG/ML ophthalmic solution Place 1 drop into  both eyes 2 (two) times daily. 11/21/19   [provider]  ?famotidine (PEPCID) 10 MG tablet Take 10 mg by mouth daily.     [provider]  ?lamoTRIgine (LAMICTAL) 25 MG tablet Take 25-50 mg by mouth See admin instructions. Taking 25 mg in the AM and 50 mg at bedtime 03/16/21   [provider]  ?latanoprost (XALATAN) 0.005 % ophthalmic solution Place 1 drop into both eyes at bedtime. 08/18/19   [provider]  ?levETIRAcetam (KEPPRA) 500 MG tablet Take 500 mg by mouth at  bedtime.    [provider]  ?LORazepam (ATIVAN) 0.5 MG tablet Take 0.5 mg by mouth every 8 (eight) hours as needed for anxiety.     [provider]  ?melatonin 5 MG TABS Take 5 mg by mouth dail

## 2021-09-01 NOTE — Assessment & Plan Note (Deleted)
? ?#)   Essential Hypertension: documented h/o such, with outpatient antihypertensive regimen including diltiazem.  SBP's in the ED today: 120s to 150s mmHg.  In the setting of presenting stroke evaluation, will observe 48 hours of permissive hypertension from last known normal, corresponding to completion of timeframe of observance of permissive hypertension at 1900 on 09/02/2021.  ? ?Plan: Close monitoring of subsequent BP via routine VS. permissive hypertension, as above.  Holding home diltiazem for now.  As needed IV hydralazine ordered for systolic blood pressure greater than 220 or diastolic blood pressure greater than 110, as further detailed above.  Monitor on telemetry. ? ?

## 2021-09-01 NOTE — ED Notes (Signed)
ED Provider at bedside. 

## 2021-09-01 NOTE — ED Notes (Signed)
Pt noted to be leaning to left in bed ?

## 2021-09-01 NOTE — Assessment & Plan Note (Deleted)
? ?#)   GERD: documented h/o such; on Pepcid as outpatient.  ? ?Plan: continue home H2 blocker.  ? ? ? ?

## 2021-09-01 NOTE — Assessment & Plan Note (Deleted)
? ?#)   Generalized anxiety disorder: Documented history of such, on as needed Ativan as an outpatient, in the absence of any scheduled SSRI or SNRI. ? ?Plan: Continue outpatient prn Ativan. ? ?

## 2021-09-01 NOTE — Assessment & Plan Note (Deleted)
? ?#)   Hypokalemia:presenting serum potassium level 3.4.  ? ?Plan: Potassium chloride 44 hours x 1 dose now.  Check serum magnesium level.  Monitor on telemetry. ? ? ?

## 2021-09-01 NOTE — ED Provider Notes (Signed)
?[facility] EMERGENCY DEPARTMENT ?Provider Note ? ? ?CSN: [id number] ?Arrival date & time: 09/01/21  1019 ? ?  ? ?History ? ?Chief Complaint  ?Patient presents with  ? Weakness  ? ? ?Hunter Chavez is a 71 y.o. male. ? ? Patient as above with significant medical history as below, including autism, dementia, seizures, hip surgery, legally blind  who presents to the ED with complaint of weakness, ?facial droop. LKN was around 7pm yesterday. Pt is legally blind. He responds to questions with yes/no but otherwise unable to contribute to history. Sister/caretaker noticed some drooping of his face on the right side yesterday evening, leaning to the right slightly this morning. No falls. He denies numbness or tingling. Denies weakness. Sister also reports reduced PO intake last 2-3 days, typically refuses most food but has been refusing more lately. Some clear rhinorrhea. Otherwise no changes.  ? ?Level 5 caveat, dev delay ? ? ? ? ?Past Medical History: ?No date: Anxiety ?No date: Dementia [facility]) ?No date: Hypertension ?No date: Learning disorder ?No date: Seizures ([facility]) ? ?Past Surgical History: ?No date: HIP SURGERY  ? ? ?The history is provided by the patient and a relative. The history is limited by a developmental delay. No language interpreter was used.  ?Weakness ? ?  ? ?Home Medications ?Prior to Admission medications   ?Medication Sig Start Date End Date Taking? Authorizing Provider  ?ascorbic acid (VITAMIN C) 500 MG tablet Take 500 mg by mouth at bedtime.   Yes [provider]  ?brimonidine (ALPHAGAN) 0.2 % ophthalmic solution Place 1 drop into both eyes 3 (three) times daily. 10/24/19  Yes [provider]  ?Cholecalciferol (VITAMIN D3) 50 MCG (2000 UT) TABS Take 2,000 Units by mouth in the morning.   Yes [provider]  ?diltiazem (CARDIZEM CD) 240 MG 24 hr capsule Take 240 mg by mouth daily. 11/02/19  Yes [provider]  ?donepezil (ARICEPT) 10 MG tablet Take 20 mg by  mouth in the morning.   Yes [provider]  ?dorzolamide-timolol (COSOPT) 22.3-6.8 MG/ML ophthalmic solution Place 1 drop into both eyes 2 (two) times daily. 11/21/19  Yes [provider]  ?famotidine (PEPCID) 10 MG tablet Take 10 mg by mouth in the morning.   Yes [provider]  ?Ferrous Sulfate Dried (HIGH POTENCY IRON) 65 MG TABS Take 65 mg by mouth at bedtime.   Yes [provider]  ?hydrOXYzine (ATARAX) 25 MG tablet Take 25 mg by mouth at bedtime. 08/09/21  Yes [provider]  ?lamoTRIgine (LAMICTAL) 25 MG tablet Take 25-50 mg by mouth See admin instructions. Take 25 mg by mouth in the morning and 50 mg at bedtime 03/16/21  Yes [provider]  ?latanoprost (XALATAN) 0.005 % ophthalmic solution Place 1 drop into both eyes at bedtime. 08/18/19  Yes [provider]  ?levETIRAcetam (KEPPRA) 500 MG tablet Take 500 mg by mouth at bedtime.   Yes [provider]  ?LORazepam (ATIVAN) 0.5 MG tablet Take 0.5 mg by mouth in the morning.   Yes [provider]  ?OLANZapine (ZYPREXA) 7.5 MG tablet Take 7.5 mg by mouth at bedtime. 01/10/21  Yes [provider]  ?pravastatin (PRAVACHOL) 20 MG tablet Take 20 mg by mouth at bedtime.   Yes [provider]  ?sertraline (ZOLOFT) 50 MG tablet Take 50 mg by mouth in the morning.   Yes [provider]  ?melatonin 5 MG TABS Take 5 mg by mouth daily as needed (to relax). ?Patient  not taking: Reported on 09/01/2021    [provider]  ?   ? ?Allergies    ?Penicillins   ? ?Review of Systems   ?Review of Systems  ?Unable to perform ROS: Dementia  ?HENT:  Positive for rhinorrhea.   ?Neurological:  Positive for facial asymmetry and weakness.  ? ?Physical Exam ?Updated Vital Signs ?BP (!) 159/57 (BP Location: Right Arm)   Pulse 62   Temp 98.5 ?F (36.9 ?C) (Oral)   Resp 20   Ht 5\' 4"  (1.626 m)   Wt 55.2 kg   SpO2 96%   BMI 20.89 kg/m?  ?Physical Exam ?Vitals and nursing note  reviewed.  ?Constitutional:   ?   General: He is not in acute distress. ?   Appearance: Normal appearance. He is well-developed. He is not ill-appearing or diaphoretic.  ?HENT:  ?   Head: Normocephalic and atraumatic.  ?   Right Ear: External ear normal.  ?   Left Ear: External ear normal.  ?   Mouth/Throat:  ?   Mouth: Mucous membranes are moist.  ?Eyes:  ?   General: No scleral icterus. ?   Comments: Pt with very poor vision, difficulty to assess. EOM appear intact when he participates in the exam. Lens abnormalities makes diff to assess pupillary abnormalities   ?Cardiovascular:  ?   Rate and Rhythm: Normal rate and regular rhythm.  ?   Pulses: Normal pulses.  ?   Heart sounds: Normal heart sounds.  ?Pulmonary:  ?   Effort: Pulmonary effort is normal. No respiratory distress.  ?   Breath sounds: Normal breath sounds.  ?Abdominal:  ?   General: Abdomen is flat.  ?   Palpations: Abdomen is soft.  ?   Tenderness: There is no abdominal tenderness.  ?Musculoskeletal:     ?   General: Normal range of motion.  ?   Cervical back: Normal range of motion.  ?   Right lower leg: No edema.  ?   Left lower leg: No edema.  ?Skin: ?   General: Skin is warm and dry.  ?   Capillary Refill: Capillary refill takes less than 2 seconds.  ?Neurological:  ?   Mental Status: He is alert and oriented to person, place, and time.  ?   GCS: GCS eye subscore is 4. GCS verbal subscore is 5. GCS motor subscore is 6.  ?   Cranial Nerves: Facial asymmetry (mild facial droop right) present.  ?   Sensory: Sensation is intact. No sensory deficit.  ?   Motor: Motor function is intact. No weakness or tremor.  ?   Coordination: Coordination is intact.  ?   Gait: Gait is intact.  ?   Comments: Variable compliance with F to N testing \ ?Strength 5/5 LUE 4/5 RUE,  ?5/5 BLLE  ?Psychiatric:     ?   Mood and Affect: Mood normal.     ?   Behavior: Behavior normal.  ? ? ?ED Results / Procedures / Treatments   ?Labs ?(all labs ordered are listed, but only  abnormal results are displayed) ?Labs Reviewed  ?CBC WITH DIFFERENTIAL/PLATELET - Abnormal; Notable for the following components:  ?    Result Value  ? Hemoglobin 12.7 (*)   ? Neutro Abs 1.6 (*)   ? All other components within normal limits  ?COMPREHENSIVE METABOLIC PANEL - Abnormal; Notable for the following components:  ? Potassium 3.4 (*)   ? AST 14 (*)   ? All other  components within normal limits  ?URINALYSIS, ROUTINE W REFLEX MICROSCOPIC - Abnormal; Notable for the following components:  ? APPearance CLOUDY (*)   ? All other components within normal limits  ?BLOOD GAS, VENOUS - Abnormal; Notable for the following components:  ? pO2, Ven 55 (*)   ? Bicarbonate 35.0 (*)   ? Acid-Base Excess 8.7 (*)   ? All other components within normal limits  ?COMPREHENSIVE METABOLIC PANEL - Abnormal; Notable for the following components:  ? Glucose, Bld 103 (*)   ? Creatinine, Ser 1.46 (*)   ? Total Protein 6.4 (*)   ? AST 13 (*)   ? GFR, Estimated 52 (*)   ? All other components within normal limits  ?CBC WITH DIFFERENTIAL/PLATELET - Abnormal; Notable for the following components:  ? Hemoglobin 12.3 (*)   ? All other components within normal limits  ?I-STAT VENOUS BLOOD GAS, ED - Abnormal; Notable for the following components:  ? pO2, Ven 61 (*)   ? Bicarbonate 33.4 (*)   ? TCO2 35 (*)   ? Acid-Base Excess 7.0 (*)   ? Potassium 3.3 (*)   ? All other components within normal limits  ?TROPONIN I (HIGH SENSITIVITY) - Abnormal; Notable for the following components:  ? Troponin I (High Sensitivity) 31 (*)   ? All other components within normal limits  ?TROPONIN I (HIGH SENSITIVITY) - Abnormal; Notable for the following components:  ? Troponin I (High Sensitivity) 30 (*)   ? All other components within normal limits  ?RESP PANEL BY RT-PCR (FLU A&B, COVID) ARPGX2  ?LIPASE, BLOOD  ?RAPID URINE DRUG SCREEN, HOSP PERFORMED  ?LIPID PANEL  ?HEMOGLOBIN A1C  ?MAGNESIUM  ?TSH  ?CBG MONITORING, ED  ? ? ?EKG ?EKG  Interpretation ? ?Date/Time:  Friday Sep 01 2021 11:20:01 EDT ?Ventricular Rate:  55 ?PR Interval:  213 ?QRS Duration: 94 ?QT Interval:  480 ?QTC Calculation: 460 ?R Axis:   29 ?Text Interpretation: Sinus rhythm Borderline prolonged PR inte

## 2021-09-01 NOTE — Assessment & Plan Note (Deleted)
? ?#)   Bradycardia: Documented heart rates in [facility] noted to be in the range of 48-63, with EKG demonstrating sinus rhythm with evidence of first-degree AV block, PR interval 213, with nonspecific T wave findings in leads III, aVF, which is notable in the setting of bradycardia, in addition to T wave inversion in V5/V6, in the absence of any ST changes, as further detailed above.  Per my preliminary chart review, did not encounter documentation of a prior history of bradycardia, while noting that the patient is on diltiazem for hypertensive control as an outpatient.  He appears asymptomatic in the setting of his mild bradycardia, without any evidence of corresponding hypotension. ? ?Plan: Monitor on telemetry.  Check serum magnesium level.  Follow-up results of echocardiogram, which has been ordered as a component of presenting stroke evaluation.  Hold home diltiazem for now.  Check TSH. ? ?

## 2021-09-01 NOTE — Assessment & Plan Note (Deleted)
?#)   Acute right facial droop and right upper extremity weakness: Concerning for acute ischemic CVA on the basis of acute onset of right facial droop at 1900 on 08/31/2021, followed by the patient waking up in the morning of 09/01/2021 with evidence to suggest a degree of right-sided weakness, with physical exam revealing mild unilateral weakness in the right upper extremity, as above; consequently, last known normal noted to be 1900 on 08/31/2021; CT head showing no evidence of acute intracranial process, including no evidence of acute intracranial hemorrhage. Additionally, CTA head and neck with IV contrast showed no evidence of large vessel occlusion, high grade stenosis, dissection, aneurysm, or thrombus.   ? ?Case/imaging d/wh the on-call neurologist, Dr. Leonel Ramsay, who recommended admission to the hospitalist service at [facility] for further evaluation and management of potential stroke, including pursuit of MRI brain.  Neurology to formally consult.  Consequently, the patient was transferred to [facility] for further stroke work-up, as above, as well as further evaluation of potential modifiable acute ischemic CVA risk factors, as further described below. ? ?Of note, the patient reportedly possesses multiple modifiable CVA risk factors including a history of hypertension, hyperlipidemia. No known history of diabetes, paroxysmal atrial fibrillation, obstructive sleep apnea.  Additionally, patient is reportedly a lifelong non-smoker. EKG in ED today showed sinus rhythm with evidence of first-degree AV block, and nonspecific T wave flattening in leads III and aVF as well as nonspecific T wave inversion in V5/V6, without any ST changes..   ? ?Patient is not a candidate for TPA administration given that presentation is outside of the window for administration of such. Current outpatient antiplatelet/anticoagulant regimen: None.  Will follow for results of impending MRI brain as well as ensuing neurology consultation  with associated ensuing recommendations, including corresponding recommendation for antiplatelet approach dependent upon his ensuing radiographic results.  As it does not appear that the patient has had aspirin earlier today, I am ordering a one-time dose of full-time aspirin, pending the above MRI brain results/impending neurology recommendations. ? ?Current outpatient anti-lipid regimen: Pravastatin.  ? ?For now, will plan for allowance for permissive hypertension for 48 hours following onset of acute focal neurologic deficits, with associated parameters further quantified below, and with period of observance of permissive hypertension to end at 1900 on 09/02/2021.  ? ? ? ?Plan: Nursing bedside swallow evaluation x 1 now, and will not initiate oral medications or diet until the patient has passed this. Head of the bed at 30 degrees. Neuro checks per protocol. VS per protocol. Will allow for permissive hypertension for 48 hours following onset of acute focal neurologic deficits, during which will hold home antihypertensive medications, with prn IV hydralazine ordered for systolic blood pressure greater than [id number] mmHg or diastolic blood pressure greater than 110 mmHg until 1900 on 09/02/2021. Monitor on telemetry, including monitoring for atrial fibrillation as modifiable risk factor for acute ischemic CVA.   MRI brain.  TTE with out bubble study has been ordered for the morning.  Check lipid panel and A1c. PT/OT consults have been ordered to occur in the morning.  Full dose aspirin x1 now.  Formal neurology consult, as above. ? ? ?

## 2021-09-01 NOTE — Assessment & Plan Note (Deleted)
? ?#)   Seizure disorder: Documented history of such, on Keppra as an outpatient.  No overt evidence of active seizures at this time. ? ?Plan: Continue home Keppra. ? ? ?

## 2021-09-01 NOTE — ED Notes (Signed)
Sister at bedside and aware of pending transfer ?

## 2021-09-02 ENCOUNTER — Observation Stay (HOSPITAL_COMMUNITY): Payer: Medicare Other

## 2021-09-02 DIAGNOSIS — I10 Essential (primary) hypertension: Secondary | ICD-10-CM | POA: Diagnosis present

## 2021-09-02 DIAGNOSIS — I440 Atrioventricular block, first degree: Secondary | ICD-10-CM | POA: Diagnosis present

## 2021-09-02 DIAGNOSIS — I351 Nonrheumatic aortic (valve) insufficiency: Secondary | ICD-10-CM | POA: Diagnosis present

## 2021-09-02 DIAGNOSIS — Z20822 Contact with and (suspected) exposure to covid-19: Secondary | ICD-10-CM | POA: Diagnosis present

## 2021-09-02 DIAGNOSIS — I248 Other forms of acute ischemic heart disease: Secondary | ICD-10-CM | POA: Diagnosis present

## 2021-09-02 DIAGNOSIS — G459 Transient cerebral ischemic attack, unspecified: Secondary | ICD-10-CM | POA: Diagnosis present

## 2021-09-02 DIAGNOSIS — G8191 Hemiplegia, unspecified affecting right dominant side: Secondary | ICD-10-CM | POA: Diagnosis present

## 2021-09-02 DIAGNOSIS — E785 Hyperlipidemia, unspecified: Secondary | ICD-10-CM | POA: Diagnosis present

## 2021-09-02 DIAGNOSIS — F819 Developmental disorder of scholastic skills, unspecified: Secondary | ICD-10-CM | POA: Diagnosis present

## 2021-09-02 DIAGNOSIS — Z7902 Long term (current) use of antithrombotics/antiplatelets: Secondary | ICD-10-CM | POA: Diagnosis not present

## 2021-09-02 DIAGNOSIS — R001 Bradycardia, unspecified: Secondary | ICD-10-CM | POA: Diagnosis present

## 2021-09-02 DIAGNOSIS — R531 Weakness: Secondary | ICD-10-CM | POA: Diagnosis present

## 2021-09-02 DIAGNOSIS — E876 Hypokalemia: Secondary | ICD-10-CM | POA: Diagnosis present

## 2021-09-02 DIAGNOSIS — S0232XD Fracture of orbital floor, left side, subsequent encounter for fracture with routine healing: Secondary | ICD-10-CM | POA: Diagnosis not present

## 2021-09-02 DIAGNOSIS — R2990 Unspecified symptoms and signs involving the nervous system: Secondary | ICD-10-CM | POA: Diagnosis not present

## 2021-09-02 DIAGNOSIS — K219 Gastro-esophageal reflux disease without esophagitis: Secondary | ICD-10-CM | POA: Diagnosis present

## 2021-09-02 DIAGNOSIS — G40909 Epilepsy, unspecified, not intractable, without status epilepticus: Secondary | ICD-10-CM | POA: Diagnosis present

## 2021-09-02 DIAGNOSIS — Z8249 Family history of ischemic heart disease and other diseases of the circulatory system: Secondary | ICD-10-CM | POA: Diagnosis not present

## 2021-09-02 DIAGNOSIS — Z833 Family history of diabetes mellitus: Secondary | ICD-10-CM | POA: Diagnosis not present

## 2021-09-02 DIAGNOSIS — N179 Acute kidney failure, unspecified: Secondary | ICD-10-CM | POA: Diagnosis not present

## 2021-09-02 DIAGNOSIS — R29810 Facial weakness: Secondary | ICD-10-CM | POA: Diagnosis present

## 2021-09-02 DIAGNOSIS — F0284 Dementia in other diseases classified elsewhere, unspecified severity, with anxiety: Secondary | ICD-10-CM | POA: Diagnosis present

## 2021-09-02 DIAGNOSIS — F02811 Dementia in other diseases classified elsewhere, unspecified severity, with agitation: Secondary | ICD-10-CM | POA: Diagnosis present

## 2021-09-02 DIAGNOSIS — F840 Autistic disorder: Secondary | ICD-10-CM | POA: Diagnosis present

## 2021-09-02 DIAGNOSIS — Z7982 Long term (current) use of aspirin: Secondary | ICD-10-CM | POA: Diagnosis not present

## 2021-09-02 DIAGNOSIS — H548 Legal blindness, as defined in USA: Secondary | ICD-10-CM | POA: Diagnosis present

## 2021-09-02 LAB — ECHOCARDIOGRAM COMPLETE
Calc EF: 51.6 %
Height: 64 in
P 1/2 time: 357 msec
S' Lateral: 4.1 cm
Single Plane A2C EF: 46.6 %
Single Plane A4C EF: 55.1 %
Weight: 1947.1 oz

## 2021-09-02 LAB — CBC WITH DIFFERENTIAL/PLATELET
Abs Immature Granulocytes: 0.01 10*3/uL (ref 0.00–0.07)
Basophils Absolute: 0 10*3/uL (ref 0.0–0.1)
Basophils Relative: 0 %
Eosinophils Absolute: 0.2 10*3/uL (ref 0.0–0.5)
Eosinophils Relative: 2 %
HCT: 39.6 % (ref 39.0–52.0)
Hemoglobin: 12.3 g/dL — ABNORMAL LOW (ref 13.0–17.0)
Immature Granulocytes: 0 %
Lymphocytes Relative: 41 %
Lymphs Abs: 2.8 10*3/uL (ref 0.7–4.0)
MCH: 26.1 pg (ref 26.0–34.0)
MCHC: 31.1 g/dL (ref 30.0–36.0)
MCV: 83.9 fL (ref 80.0–100.0)
Monocytes Absolute: 0.8 10*3/uL (ref 0.1–1.0)
Monocytes Relative: 12 %
Neutro Abs: 3 10*3/uL (ref 1.7–7.7)
Neutrophils Relative %: 45 %
Platelets: 195 10*3/uL (ref 150–400)
RBC: 4.72 MIL/uL (ref 4.22–5.81)
RDW: 12.5 % (ref 11.5–15.5)
WBC: 6.8 10*3/uL (ref 4.0–10.5)
nRBC: 0 % (ref 0.0–0.2)

## 2021-09-02 LAB — HEMOGLOBIN A1C
Hgb A1c MFr Bld: 5.6 % (ref 4.8–5.6)
Mean Plasma Glucose: 114.02 mg/dL

## 2021-09-02 LAB — COMPREHENSIVE METABOLIC PANEL
ALT: 10 U/L (ref 0–44)
AST: 13 U/L — ABNORMAL LOW (ref 15–41)
Albumin: 3.5 g/dL (ref 3.5–5.0)
Alkaline Phosphatase: 79 U/L (ref 38–126)
Anion gap: 7 (ref 5–15)
BUN: 9 mg/dL (ref 8–23)
CO2: 31 mmol/L (ref 22–32)
Calcium: 9.3 mg/dL (ref 8.9–10.3)
Chloride: 102 mmol/L (ref 98–111)
Creatinine, Ser: 1.46 mg/dL — ABNORMAL HIGH (ref 0.61–1.24)
GFR, Estimated: 52 mL/min — ABNORMAL LOW (ref >60)
Glucose, Bld: 103 mg/dL — ABNORMAL HIGH (ref 70–99)
Potassium: 3.9 mmol/L (ref 3.5–5.1)
Sodium: 140 mmol/L (ref 135–145)
Total Bilirubin: 0.4 mg/dL (ref 0.3–1.2)
Total Protein: 6.4 g/dL — ABNORMAL LOW (ref 6.5–8.1)

## 2021-09-02 LAB — LIPID PANEL
Cholesterol: 153 mg/dL (ref 0–200)
HDL: 48 mg/dL (ref >40)
LDL Cholesterol: 90 mg/dL (ref 0–99)
Total CHOL/HDL Ratio: 3.2 RATIO
Triglycerides: 77 mg/dL (ref <150)
VLDL: 15 mg/dL (ref 0–40)

## 2021-09-02 LAB — TSH: TSH: 3.458 u[IU]/mL (ref 0.350–4.500)

## 2021-09-02 LAB — MAGNESIUM: Magnesium: 1.9 mg/dL (ref 1.7–2.4)

## 2021-09-02 MED ORDER — LORAZEPAM 2 MG/ML IJ SOLN
0.5000 mg | INTRAMUSCULAR | Status: DC | PRN
  Administered 2021-09-02: 0.5 mg via INTRAVENOUS
  Filled 2021-09-02: qty 1

## 2021-09-02 MED ORDER — CLOPIDOGREL BISULFATE 75 MG PO TABS
75.0000 mg | ORAL_TABLET | Freq: Every day | ORAL | Status: DC
  Administered 2021-09-02 – 2021-09-03 (×2): 75 mg via ORAL
  Filled 2021-09-02 (×2): qty 1

## 2021-09-02 MED ORDER — ATORVASTATIN CALCIUM 40 MG PO TABS
40.0000 mg | ORAL_TABLET | Freq: Every day | ORAL | Status: DC
  Administered 2021-09-02 – 2021-09-03 (×2): 40 mg via ORAL
  Filled 2021-09-02 (×2): qty 1

## 2021-09-02 MED ORDER — SODIUM CHLORIDE 0.9 % IV SOLN: INTRAVENOUS | Status: DC

## 2021-09-02 MED ORDER — ASPIRIN EC 81 MG PO TBEC
81.0000 mg | DELAYED_RELEASE_TABLET | Freq: Every day | ORAL | Status: DC
  Administered 2021-09-02 – 2021-09-03 (×2): 81 mg via ORAL
  Filled 2021-09-02 (×2): qty 1

## 2021-09-02 NOTE — Evaluation (Signed)
Physical Therapy Evaluation ?Patient Details ?Name: Hunter Chavez ?MRN: [id number] ?DOB: 04/16/1951 ?Today's Date: 09/02/2021 ? ?History of Present Illness ? Franz Svec is a 71 y.o. male who presented to [facility] for evaluation of right sided facial droop. Pt with past medical history of HTN, HLD, developmental delay, Dementia, Seizure disorder, anxiety legally blind bilaterally ?  ?Clinical Impression ? Pt admitted with above. Per sister and brother in law pt is at baseline cognitively and functionally. Pt initially cooperative and pleasant and transferred self to EOB and then when cues to stand up and amb pt became irritated and resistive and wouldn't respond to PT/OT or sister and brother in law. Sister reports "that's the bilpolar, he switches just like that. In 5 min he'll be laughing." PT/OT able to transfer to chair with max tactile cues as pt very retropulsive. Acute PT to follow to mobilize pt while in hospital to prevent deconditioning pending pt participation however pt is at baseline and does not need any PT follow up upon d/c.  ?   ? ?Recommendations for follow up therapy are one component of a multi-disciplinary discharge planning process, led by the attending physician.  Recommendations may be updated based on patient status, additional functional criteria and insurance authorization. ? ?Follow Up Recommendations No PT follow up ? ?  ?Assistance Recommended at Discharge Frequent or constant Supervision/Assistance  ?Patient can return home with the following ?   ? ?  ?Equipment Recommendations None recommended by PT  ?Recommendations for Other Services ?    ?  ?Functional Status Assessment Patient has not had a recent decline in their functional status  ? ?  ?Precautions / Restrictions Precautions ?Precautions: Fall ?Precaution Comments: dev delay ?Restrictions ?Weight Bearing Restrictions: No  ? ?  ? ?Mobility ? Bed Mobility ?Overal bed mobility: Needs Assistance ?Bed Mobility: Supine to Sit, Sit to  Supine ?  ?  ?Supine to sit: Min assist ?Sit to supine: Max assist, +2 for safety/equipment ?  ?General bed mobility comments: assist levels for cognition and resistive nature by the end of the session. required cues from brother in law ?  ? ?Transfers ?Overall transfer level: Needs assistance ?Equipment used: 2 person hand held assist ?Transfers: Sit to/from Stand, Bed to chair/wheelchair/BSC ?Sit to Stand: Min assist ?Stand pivot transfers: Max assist, +2 safety/equipment ?  ?  ?  ?  ?General transfer comment: assist initally for cues, and increased to max +2 physical assist for physical resistance and pt not following commands, repeatedly saying "nope" ?  ? ?Ambulation/Gait ?  ?  ?  ?  ?  ?  ?  ?General Gait Details: pt resistive and would not ambulate, per family when pt wants to walk he walks indep ? ?Stairs ?  ?  ?  ?  ?  ? ?Wheelchair Mobility ?  ? ?Modified Rankin (Stroke Patients Only) ?  ? ?  ? ?Balance Overall balance assessment: Needs assistance ?Sitting-balance support: Feet supported ?Sitting balance-Leahy Scale: Fair ?  ?  ?Standing balance support: Bilateral upper extremity supported ?Standing balance-Leahy Scale: Poor ?Standing balance comment: pt with strong retropulsion but also wouldn't sit down either ?  ?  ?  ?  ?  ?  ?  ?  ?  ?  ?  ?   ? ? ? ?Pertinent Vitals/Pain Pain Assessment ?Pain Assessment: Faces ?Faces Pain Scale: No hurt ?Breathing: normal ?Negative Vocalization: none ?Facial Expression: smiling or inexpressive ?Body Language: relaxed ?Consolability: no need to console ?PAINAD Score: 0 ?Pain  Intervention(s): Monitored during session  ? ? ?Home Living Family/patient expects to be discharged to:: Private residence ?Living Arrangements: Other (Comment) ?Available Help at Discharge: Family;Available 24 hours/day ?Type of Home: House ?Home Access: Stairs to enter ?  ?Entrance Stairs-Number of Steps: 2 ?Alternate Level Stairs-Number of Steps: flight ?Home Layout: Two level;Full bath on main  level ?Home Equipment: Shower seat;Wheelchair - manual ?Additional Comments: Pt lives with sister and brother in law who care for pt 24/7  ?  ?Prior Function Prior Level of Function : Needs assist ?  ?  ?  ?  ?  ?  ?Mobility Comments: walks in the home without assist, alwayas has assist for steps. uses WC for community mobility, pushes cart at the store, or his HHA for short distances outside of the home ?ADLs Comments: sister assists with all aspects of ADLs - differing levels depending on pt's mood in that moment ?  ? ? ?Hand Dominance  ?   ? ?  ?Extremity/Trunk Assessment  ? Upper Extremity Assessment ?Upper Extremity Assessment: Defer to OT evaluation;Difficult to assess due to impaired cognition (over pt funtionaly using bilat UEs) ?  ? ?Lower Extremity Assessment ?Lower Extremity Assessment: Difficult to assess due to impaired cognition (however able to move bilat LEs and resist all movements) ?  ? ?Cervical / Trunk Assessment ?Cervical / Trunk Assessment: Kyphotic (forward head)  ?Communication  ? Communication: No difficulties  ?Cognition Arousal/Alertness: Awake/alert ?Behavior During Therapy: Impulsive, Agitated ?Overall Cognitive Status: History of cognitive impairments - at baseline ?  ?  ?  ?  ?  ?  ?  ?  ?  ?  ?  ?  ?  ?  ?  ?  ?General Comments: pt follows brother-in-law's commands better than therapist. Became resistant once asked to ambulate. Family confirms this is his baseline cognition. Sister states "he switchs so fast. Like if you come back in 5 min he will probably be laughing and joking around" ?  ?  ? ?  ?General Comments General comments (skin integrity, edema, etc.): VSS on RA, sister and brother in law very supportive ? ?  ?Exercises    ? ?Assessment/Plan  ?  ?PT Assessment Patient needs continued PT services  ?PT Problem List Decreased strength;Decreased range of motion;Decreased activity tolerance;Decreased balance;Decreased coordination;Decreased cognition;Decreased knowledge of use of  DME;Decreased safety awareness ? ?   ?  ?PT Treatment Interventions DME instruction;Gait training;Stair training;Functional mobility training;Therapeutic activities;Therapeutic exercise;Balance training   ? ?PT Goals (Current goals can be found in the Care Plan section)  ?Acute Rehab PT Goals ?Patient Stated Goal: unable to state ?PT Goal Formulation: With patient/family ?Time For Goal Achievement: 09/16/21 ?Potential to Achieve Goals: Good ? ?  ?Frequency Min 2X/week ?  ? ? ?Co-evaluation PT/OT/SLP Co-Evaluation/Treatment: Yes ?Reason for Co-Treatment: Necessary to address cognition/behavior during functional activity ?PT goals addressed during session: Mobility/safety with mobility ?  ?  ? ? ?  ?AM-PAC PT "6 Clicks" Mobility  ?Outcome Measure Help needed turning from your back to your side while in a flat bed without using bedrails?: None ?Help needed moving from lying on your back to sitting on the side of a flat bed without using bedrails?: None ?Help needed moving to and from a bed to a chair (including a wheelchair)?: A Lot ?Help needed standing up from a chair using your arms (e.g., wheelchair or bedside chair)?: A Lot ?Help needed to walk in hospital room?: A Lot ?Help needed climbing 3-5 steps with a railing? :  A Lot ?6 Click Score: 16 ? ?  ?End of Session Equipment Utilized During Treatment: Gait belt ?Activity Tolerance: Treatment limited secondary to agitation ?Patient left: in bed;with call bell/phone within reach;with bed alarm set;with nursing/sitter in room;with family/visitor present (pt had to be returned back to bed upon sitting in chair due to ECHO needing to be done at bedside and technician unable to complete in recliner despite Dr. Chipper Oman and families recommendations.) ?Nurse Communication: Mobility status ?PT Visit Diagnosis: Unsteadiness on feet (R26.81);Muscle weakness (generalized) (M62.81);Difficulty in walking, not elsewhere classified (R26.2) ?  ? ?Time: 5409-8119 ?PT Time  Calculation (min) (ACUTE ONLY): 23 min ? ? ?Charges:   PT Evaluation ?$PT Eval Moderate Complexity: 1 Mod ?  ?  ?   ? ? ?Lewis Shock, PT, DPT ?Acute Rehabilitation Services ?Secure chat preferred ?Office #

## 2021-09-02 NOTE — Progress Notes (Signed)
?  Echocardiogram ?2D Echocardiogram has been performed. ? ?Hunter Chavez ?09/02/2021, 10:31 AM ?

## 2021-09-02 NOTE — Evaluation (Signed)
Occupational Therapy Evaluation ?Patient Details ?Name: Hunter Chavez ?MRN: [id number] ?DOB: Mar 03, 1951 ?Today's Date: 09/02/2021 ? ? ?History of Present Illness Hunter Chavez is a 71 y.o. male who presented to [facility] for evaluation of right sided facial droop. Pt with past medical history of HTN, HLD, developmental delay, Dementia, Seizure disorder, anxiety legally blind bilaterally  ? ?Clinical Impression ?  ?[name] was evaluated s/p the above admission list. He lives with his sister and brother in law who assist him with all aspects of his care at baseline and provide 24/7 assist. Upon evaluation pt was able to get OOB with min A following his brother in laws cues. However once standing, pt became more resistive to tasks being asked of him and needed max A +2 for safety for transfer into and out of the recliner. Anticipate pt needs min A-max A for all Adls. Pt's sister and brother in law confirm that he is at his cognitive and functional baseline. Pt does not need further OT acutely. Recommend d/c back to his home with familiar environment and routine.  ?   ? ?Recommendations for follow up therapy are one component of a multi-disciplinary discharge planning process, led by the attending physician.  Recommendations may be updated based on patient status, additional functional criteria and insurance authorization.  ? ?Follow Up Recommendations ? No OT follow up  ?  ?Assistance Recommended at Discharge Frequent or constant Supervision/Assistance  ?Patient can return home with the following A lot of help with walking and/or transfers;A lot of help with bathing/dressing/bathroom;Assistance with cooking/housework;Direct supervision/assist for medications management;Assist for transportation;Help with stairs or ramp for entrance;Assistance with feeding ? ?  ?Functional Status Assessment ? Patient has had a recent decline in their functional status and demonstrates the ability to make significant improvements in function  in a reasonable and predictable amount of time.  ?Equipment Recommendations ? None recommended by OT  ?  ?Recommendations for Other Services   ? ? ?  ?Precautions / Restrictions Precautions ?Precautions: Fall ?Precaution Comments: dev delay ?Restrictions ?Weight Bearing Restrictions: No  ? ?  ? ?Mobility Bed Mobility ?Overal bed mobility: Needs Assistance ?Bed Mobility: Supine to Sit, Sit to Supine ?  ?  ?Supine to sit: Min assist ?Sit to supine: Max assist, +2 for safety/equipment ?  ?General bed mobility comments: assist levels for cognition and resistive nature by the end of the session. required cues from brother in law ?  ? ?Transfers ?Overall transfer level: Needs assistance ?Equipment used: 2 person hand held assist ?Transfers: Sit to/from Stand, Bed to chair/wheelchair/BSC ?Sit to Stand: Min assist ?Stand pivot transfers: Max assist, +2 safety/equipment ?  ?  ?  ?  ?General transfer comment: assist initally for cues, and increased to max +2 physical assist for physical resistance adn pt ot following commands ?  ? ?  ?Balance Overall balance assessment: Needs assistance ?Sitting-balance support: Feet supported ?Sitting balance-Leahy Scale: Fair ?  ?  ?Standing balance support: Bilateral upper extremity supported ?Standing balance-Leahy Scale: Poor ?  ?  ?  ?  ?  ?  ?  ?  ?  ?  ?  ?  ?   ? ?ADL either performed or assessed with clinical judgement  ? ?ADL Overall ADL's : At baseline;Needs assistance/impaired ?  ?  ?  ?  ?  ?  ?  ?  ?  ?  ?  ?  ?  ?  ?  ?  ?  ?  ?  ?General ADL Comments:  Pt is at his fucntional baseline. Per family, he always needs assist with ADLs, and id demonstrating baseline fucnitonal movement, affect and cognition  ? ? ? ?Vision Baseline Vision/History: 2 Legally blind (L worse than R) ?Patient Visual Report: No change from baseline ?Vision Assessment?: Vision impaired- to be further tested in functional context ?Additional Comments: legally blind - able to see shapes/shadows & watch Ipad  to some extent  ?   ?Perception   ?  ?Praxis   ?  ? ?Pertinent Vitals/Pain Pain Assessment ?Pain Assessment: Faces ?Faces Pain Scale: No hurt ?Pain Intervention(s): Monitored during session  ? ? ? ?Hand Dominance   ?  ?Extremity/Trunk Assessment Upper Extremity Assessment ?Upper Extremity Assessment: Difficult to assess due to impaired cognition (unable to assess, pt moving BUEs equally through full range with good strength) ?  ?Lower Extremity Assessment ?Lower Extremity Assessment: Defer to PT evaluation ?  ?Cervical / Trunk Assessment ?Cervical / Trunk Assessment: Kyphotic (forward head) ?  ?Communication Communication ?Communication: No difficulties ?  ?Cognition Arousal/Alertness: Awake/alert ?Behavior During Therapy: Impulsive, Agitated ?Overall Cognitive Status: History of cognitive impairments - at baseline ?  ?  ?  ?  ?  ?  ?  ?  ?  ?  ?  ?  ?  ?  ?  ?  ?General Comments: pt follows brother-in-law's commands better than therapist. Became resistant once asked to ambulate. Family confirms this is his baseline cognition ?  ?  ?General Comments  VSS on RA, sister and brother in law present and supportive ? ?  ?Exercises   ?  ?Shoulder Instructions    ? ? ?Home Living Family/patient expects to be discharged to:: Private residence ?Living Arrangements: Other (Comment) ?Available Help at Discharge: Family;Available 24 hours/day ?Type of Home: House ?Home Access: Stairs to enter ?Entrance Stairs-Number of Steps: 2 ?  ?Home Layout: Two level;Full bath on main level ?Alternate Level Stairs-Number of Steps: flight ?  ?Bathroom Shower/Tub: Tub/shower unit ?  ?Bathroom Toilet: Handicapped height ?  ?  ?Home Equipment: Shower seat;Wheelchair - manual ?  ?Additional Comments: Pt lives with sister and brother in law who care for pt 24/7 ?  ? ?  ?Prior Functioning/Environment Prior Level of Function : Needs assist ?  ?  ?  ?  ?  ?  ?Mobility Comments: walks in the home without assist, alwayas has assist for steps. uses WC  for community mobility, pushes cart at the store, or his HHA for short distances outside of the home ?ADLs Comments: sister assists with all aspects of ADLs - differing levels depending on pt's mood in that moment ?  ? ?  ?  ?OT Problem List: Decreased cognition;Decreased safety awareness ?  ?   ?OT Treatment/Interventions:    ?  ?OT Goals(Current goals can be found in the care plan section) Acute Rehab OT Goals ?OT Goal Formulation: All assessment and education complete, DC therapy  ?OT Frequency:   ?  ? ?Co-evaluation PT/OT/SLP Co-Evaluation/Treatment: Yes ?Reason for Co-Treatment: Necessary to address cognition/behavior during functional activity;For patient/therapist safety ?  ?OT goals addressed during session: ADL's and self-care ?  ? ?  ?AM-PAC OT "6 Clicks" Daily Activity     ?Outcome Measure Help from another person eating meals?: A Lot ?Help from another person taking care of personal grooming?: A Lot ?Help from another person toileting, which includes using toliet, bedpan, or urinal?: A Lot ?Help from another person bathing (including washing, rinsing, drying)?: A Lot ?Help from another person to  put on and taking off regular upper body clothing?: A Lot ?Help from another person to put on and taking off regular lower body clothing?: A Lot ?6 Click Score: 12 ?  ?End of Session Equipment Utilized During Treatment: Gait belt ?Nurse Communication: Mobility status ? ?Activity Tolerance: Treatment limited secondary to agitation;Patient tolerated treatment well ?Patient left: in bed;with call bell/phone within reach;with family/visitor present;Other (comment) (getting echo) ? ?OT Visit Diagnosis: Unsteadiness on feet (R26.81)  ?              ?Time: [id number] ?OT Time Calculation (min): 27 min ?Charges:  OT General Charges ?$OT Visit: 1 Visit ?OT Evaluation ?$OT Eval Moderate Complexity: 1 Mod ? ? ?Molly Savarino A Obe Ahlers ?09/02/2021, 12:20 PM ?

## 2021-09-02 NOTE — Progress Notes (Signed)
?  Progress Note ?Patient: Hunter Chavez [id number] DOB: 1950-08-31 DOA: 09/01/2021  ?DOS: the patient was seen and examined on 09/02/2021 ? ?Brief hospital course: ?Hunter Chavez is a 71 y.o. male with medical history significant for hypertension, hyperlipidemia, developmental delay, dementia, GAD, seizures, legal blindness bilaterally, presented with complaints of right-sided facial droop.  Possibly having TIA.  MRI negative for stroke.  Work-up shows aortic insufficiency. ? ?Assessment and Plan: ?Principal Problem: ?  Stroke-like symptoms ?CT of the head unremarkable for any acute stroke. ?MRI brain also negative for any stroke. ?CT angio head and neck negative for any large vessel occlusion. ?At present appears to have a TIA. ?Not on any antithrombotic medication prior to admission.  Currently on aspirin. ?Given high risk factors patient will also be on 3 weeks of Plavix. ?LDL 90 goal less than 70, continue statin increase from pravastatin 20 mg to Lipitor 40 mg. ?Hemoglobin A1c 5.6. ?Passed swallowing evaluation. ?PT OT recommends no therapy requirement. ? ?Acute kidney injury. ?Baseline serum creatinine normal. ?On admission serum creatinine 1.14.  Currently trending up to 1.46. ?We will provide with IV hydration and monitor response. ? ?Aortic insufficiency. ?Seen incidentally on the echocardiogram. ?Outpatient referral to cardiology. ? ?Essential hypertension ?Blood pressure stable. ?Allowing permissive hypertension. ? ?Elevated troponin ?Minimally elevated. ?Echocardiogram negative for any wall motion abnormality. ?Currently no chest pain. ?No further work-up. ? ?Hypokalemia ?We will replace. ? ?Sinus bradycardia ?Monitor. ?Avoid beta-blockers. ? ?Anxiety ?Continue home regimen. ? ?Seizures ([place]) ?On Keppra 500 nightly. ?Monitor. ? ?GERD (gastroesophageal reflux disease) ?Continue Pepcid ? ?Chronic vision loss. 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 ?Patient follows up with  ophthalmology outpatient.  No inpatient work-up necessary. ? ?Subjective: Denies any acute complaint no nausea no vomiting.  Oral intake adequate. ? ?Physical Exam: ?Vitals:  ? 09/02/21 0837 09/02/21 1117 09/02/21 1536 09/02/21 1604  ?BP: (!) 167/47 (!) 159/57 (!) 175/62 (!) 167/52  ?Pulse: (!) 56 62 62 (!) 57  ?Resp: 20 20 20    ?Temp: 97.9 ?F (36.6 ?C) 98.5 ?F (36.9 ?C) 97.8 ?F (36.6 ?C)   ?TempSrc: Oral Oral Oral   ?SpO2: 96% 96% 98%   ?Weight:      ?Height:      ? ?General: Appear in mild distress; no visible Abnormal Neck Mass Or lumps, Conjunctiva normal ?Cardiovascular: S1 and S2 Present, no Murmur, ?Respiratory: good respiratory effort, Bilateral Air entry present and CTA, no Crackles, no wheezes ?Abdomen: Bowel Sound present, Non tender ?Extremities: no Pedal edema ?Neurology: alert and oriented to time, place, and person ?Gait not checked due to patient safety concerns  ? ?Data Reviewed: ?I have Reviewed nursing notes, Vitals, and Lab results since pt's last encounter. Pertinent lab results CBC and BMP ?I have ordered test including CBC and BMP ?I have discussed pt's care plan and test results with neurology.  ? ?Family Communication: Sister at bedside ? ?Disposition: ?Status is: Inpatient ?Remains inpatient appropriate because: Ongoing issues with renal dysfunction requiring IV fluids. ? ?Author: ?Berle Mull, MD ?09/02/2021 7:20 PM ? ?Please look on [email or web address] to find out who is on call. ?

## 2021-09-02 NOTE — Plan of Care (Signed)
?  Problem: Education: ?Goal: Knowledge of disease or condition will improve ?Outcome: Progressing ?Goal: Knowledge of secondary prevention will improve (SELECT ALL) ?Outcome: Progressing ?  ?Problem: Coping: ?Goal: Will verbalize positive feelings about self ?Outcome: Progressing ?  ?Problem: Self-Care: ?Goal: Ability to participate in self-care as condition permits will improve ?Outcome: Progressing ?Goal: Ability to communicate needs accurately will improve ?Outcome: Progressing ?  ?Problem: Nutrition: ?Goal: Risk of aspiration will decrease ?Outcome: Progressing ?  ?Problem: Ischemic Stroke/TIA Tissue Perfusion: ?Goal: Complications of ischemic stroke/TIA will be minimized ?Outcome: Progressing ?  ?

## 2021-09-02 NOTE — Consult Note (Addendum)
Neurology Consultation ? ?Reason for Consult: right sided facial droop  ?Referring Physician: Dr. Allena Katz  ? ?CC: right sided facial droop  ? ?History is obtained from:medical record  ? ?HPI: Hunter Chavez is a 71 y.o. male with past medical history of HTN, HLD, developmental delay, Dementia, Seizure disorder, anxiety legally blind bilaterally who presented to [facility] for evaluation of right sided facial droop.  ?From Dr. Malena Catholic note: ?"Patient's sister noted the patient to exhibit new onset right-sided facial droop starting at approximately 1900 on 08/31/2021.  At that time, she noted no additional acute focal neurologic deficits, including no overt evidence of unilateral weakness.  The patient went to bed on the evening of 08/31/2021 with persistence of the aforementioned right-sided facial droop, before awakening on the morning of 09/01/2021, at which time sister noted the patient to be leaning to his right side, representing a change from his baseline posture/stance.  This was in addition to noting persistence of the aforementioned right-sided facial droop" ?There is no family at bedside, unable to obtain history from patient due to his developmental delay.  ?Neurology consulted for evaluation of right side facial droop and stroke workup  ? ?LKW: 08/31/2021 ?tpa given?: no, outside window ?Premorbid modified Rankin scale (mRS):  ?3-Moderate disability-requires help but walks WITHOUT assistance ? ?ROS: Unable to obtain due to dementia and developmental delay ? ?Past Medical History:  ?Diagnosis Date  ? Anxiety   ? Dementia (HCC)   ? Hypertension   ? Learning disorder   ? Seizures (HCC)   ? ? ? ?Essential (primary) hypertension  ? ?Family History  ?Problem Relation Age of Onset  ? Hypertension Mother   ? Diabetes Mellitus II Mother   ? Hypertension Sister   ? Hypertension Brother   ? ? ? ?Social History:  ? reports that he has never smoked. He has never used smokeless tobacco. He reports that he does not drink alcohol  and does not use drugs. ? ?Medications ? ?Current Facility-Administered Medications:  ?  0.9 %  sodium chloride infusion, , Intravenous, Continuous, Rolly Salter, MD ?  acetaminophen (TYLENOL) tablet 650 mg, 650 mg, Oral, Q6H PRN **OR** acetaminophen (TYLENOL) suppository 650 mg, 650 mg, Rectal, Q6H PRN, Howerter, Justin B, DO ?  brimonidine (ALPHAGAN) 0.2 % ophthalmic solution 1 drop, 1 drop, Both Eyes, TID, Howerter, Justin B, DO, 1 drop at 09/01/21 2315 ?  donepezil (ARICEPT) tablet 20 mg, 20 mg, Oral, Daily, Tanda Rockers A, DO ?  dorzolamide-timolol (COSOPT) 22.3-6.8 MG/ML ophthalmic solution 1 drop, 1 drop, Both Eyes, BID, Howerter, Justin B, DO, 1 drop at 09/01/21 2222 ?  famotidine (PEPCID) tablet 10 mg, 10 mg, Oral, Daily, Tanda Rockers A, DO ?  hydrALAZINE (APRESOLINE) injection 10 mg, 10 mg, Intravenous, Q4H PRN, Howerter, Justin B, DO ?  lamoTRIgine (LAMICTAL) tablet 25 mg, 25 mg, Oral, Daily **AND** lamoTRIgine (LAMICTAL) tablet 50 mg, 50 mg, Oral, QHS, Doristine Counter, RPH, 50 mg at 09/01/21 2124 ?  latanoprost (XALATAN) 0.005 % ophthalmic solution 1 drop, 1 drop, Both Eyes, QHS, Howerter, Justin B, DO, 1 drop at 09/01/21 2248 ?  levETIRAcetam (KEPPRA) tablet 500 mg, 500 mg, Oral, QHS, Tanda Rockers A, DO, 500 mg at 09/01/21 2124 ?  LORazepam (ATIVAN) tablet 0.5 mg, 0.5 mg, Oral, Q8H PRN, Tanda Rockers A, DO ?  melatonin tablet 5 mg, 5 mg, Oral, QHS PRN, Tanda Rockers A, DO ?  OLANZapine (ZYPREXA) tablet 7.5 mg, 7.5 mg, Oral, QHS, Tanda Rockers A, DO, 7.5  mg at 09/01/21 2124 ?  pravastatin (PRAVACHOL) tablet 20 mg, 20 mg, Oral, Daily, Tanda Rockers A, DO ? ? ?Exam: ?Current vital signs: ?BP (!) 136/51 (BP Location: Right Arm)   Pulse (!) 54   Temp 97.9 ?F (36.6 ?C) (Oral)   Resp 19   Ht 5\' 4"  (1.626 m)   Wt 55.2 kg   SpO2 98%   BMI 20.89 kg/m?  ?Vital signs in last 24 hours: ?Temp:  [97.8 ?F (36.6 ?C)-98.7 ?F (37.1 ?C)] 97.9 ?F (36.6 ?C) (05/06 0306) ?Pulse Rate:  [48-61] 54 (05/06 0306) ?Resp:   [15-22] 19 (05/06 0306) ?BP: (123-159)/(45-60) 136/51 (05/06 0306) ?SpO2:  [90 %-98 %] 98 % (05/06 0306) ?Weight:  [55.2 kg-59 kg] 55.2 kg (05/06 0544) ? ?GENERAL: sleeping comfortable in bed. NAD ?HEENT: - Normocephalic and atraumatic, dry mm ?LUNGS - Clear to auscultation bilaterally with no wheezes ?CV - S1S2 RRR, no m/r/g, equal pulses bilaterally. ?ABDOMEN - Soft, nontender, nondistended with normoactive BS ?Ext: warm, well perfused, intact peripheral pulses, no edema ? ?NEURO:  ?Mental Status: AA&O to self. Follows simple commands. Moves all extremities spontaneously ?Cranial Nerves: Pupils- legally blind, EOMI, visual fields no blink to threat, no facial asymmetry, facial sensation intact, hearing intact, tongue/uvula/soft palate midline, normal sternocleidomastoid and trapezius muscle strength. No evidence of tongue atrophy or fibrillations ?Motor: 5/5 in left upper and 4/5 left lower, right upper 4/5 and right lower 4/5 ?Tone: is normal and bulk is normal ?Sensation- Intact to light touch bilaterally ?Coordination: unable to assess ?Gait- deferred ? ?NIHSS ?1a Level of Conscious.: 0 ?1b LOC Questions: 1 ?1c LOC Commands: 0 ?2 Best Gaze: 0 ?3 Visual: 3 (baseline) ?4 Facial Palsy: 0 ?5a Motor Arm - left: 0 ?5b Motor Arm - Right: 1 ?6a Motor Leg - Left: 0 ?6b Motor Leg - Right: 0 ?7 Limb Ataxia: 0 ?8 Sensory: 0 ?9 Best Language: 0 ?10 Dysarthria: 0 ?11 Extinct. and Inatten.: 0 ?TOTAL: 4  ? ? ?Labs: ?A1c 5.6 ?LDL-c 90 ?TSH 3.458 ? ?Imaging ?I have reviewed the images obtained: ? ?CT-head 5/5: ?-No acute intracranial finding by CT. 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 ?-Old left orbital floor and medial wall blowout fractures. ? ?CTA H/N 5/4: ?1. Normal vasculature of the head and neck. ?2. Bilateral lens dislocations with hyperdense material in the left globe which may reflect blood or possibly postprocedural change. Correlate with history and funduscopic exam. ?3.  Dilated main pulmonary artery suggesting pulmonary hypertension. ?  ?Assessment:  ?Hunter Chavez is a 71 y.o. male with past medical history of HTN, HLD, developmental delay, Dementia, Seizure disorder, anxiety legally blind bilaterally who presented to [facility] for evaluation of right sided facial droop.  ?He developed right sided facial droop on 5/4 and then on 5/5 was noticed to be leaning to the right and was brought to ED for evaluation  ? ?Recommendations: ?- MRI of the brain without contrast ?- Frequent neuro checks ?- Echocardiogram ?- Prophylactic therapy-Antiplatelet med: Aspirin - dose 81mg   ?- Risk factor modification ?- Telemetry monitoring ?- PT consult, OT consult, Speech consult ? ?7/4 DNP, ACNPC-AG  ? ?I have seen and evaluated the patient. I have reviewed the above note and made appropriate changes.  ? ?Family reports that he is at baseline, slightly agitated when moving to a chair.  ? ?His right sided weakness is improved. There is a slight chance for an unwitnessed seizure with post-ictal state, but given that  he has been well controlled for years, would not make any changes without more concrete evidence.  ? ?I suspect this was rather TIA. Given he is agitated, I am not sure if he is going to cooperate with MRI. It would be reasonable to try this, but if unable, I would not push to aggressive sedation, etc.  ? ?Agree with antiplatelet therapy with ASA, no stenosis on CTA, echo pending. He will need more aggressive statin therapy for LDL of 90.  ? ?1) ASA 81 daily, plavix 75mg  daily x 3 weeks.  ?2) Increase statin to atorvastatin 40mg  dialy.  ?3) MRI if able ?4) PT, OT, ST ?5) echo ?6) stroke team to follow ? ?Ritta SlotMcNeill Sian Joles, MD ?[facility] ?[phone] ? ?If 7pm- 7am, please page neurology on call as listed in AMION. ? ? ? ?

## 2021-09-02 NOTE — Hospital Course (Signed)
Hunter Chavez is a 71 y.o. male with medical history significant for hypertension, hyperlipidemia, developmental delay, dementia, GAD, seizures, legal blindness bilaterally, presented with complaints of right-sided facial droop.  Possibly having TIA.  MRI negative for stroke.  Work-up shows aortic insufficiency. ?

## 2021-09-03 DIAGNOSIS — R2990 Unspecified symptoms and signs involving the nervous system: Secondary | ICD-10-CM | POA: Diagnosis not present

## 2021-09-03 LAB — RENAL FUNCTION PANEL
Albumin: 2.9 g/dL — ABNORMAL LOW (ref 3.5–5.0)
Anion gap: 3 — ABNORMAL LOW (ref 5–15)
BUN: 10 mg/dL (ref 8–23)
CO2: 30 mmol/L (ref 22–32)
Calcium: 9 mg/dL (ref 8.9–10.3)
Chloride: 106 mmol/L (ref 98–111)
Creatinine, Ser: 1.32 mg/dL — ABNORMAL HIGH (ref 0.61–1.24)
GFR, Estimated: 59 mL/min — ABNORMAL LOW (ref >60)
Glucose, Bld: 90 mg/dL (ref 70–99)
Phosphorus: 4.3 mg/dL (ref 2.5–4.6)
Potassium: 3.2 mmol/L — ABNORMAL LOW (ref 3.5–5.1)
Sodium: 139 mmol/L (ref 135–145)

## 2021-09-03 MED ORDER — ATORVASTATIN CALCIUM 40 MG PO TABS: 40.0000 mg | ORAL_TABLET | Freq: Every day | ORAL | 0 refills | Status: AC

## 2021-09-03 MED ORDER — AMLODIPINE BESYLATE 5 MG PO TABS: 5.0000 mg | ORAL_TABLET | Freq: Every day | ORAL | 11 refills | Status: AC

## 2021-09-03 MED ORDER — POTASSIUM CHLORIDE CRYS ER 20 MEQ PO TBCR
40.0000 meq | EXTENDED_RELEASE_TABLET | Freq: Once | ORAL | Status: AC
  Administered 2021-09-03: 40 meq via ORAL
  Filled 2021-09-03: qty 2

## 2021-09-03 MED ORDER — ASPIRIN 81 MG PO TBEC
81.0000 mg | DELAYED_RELEASE_TABLET | Freq: Every day | ORAL | 11 refills | Status: AC
Start: 2021-09-04 — End: ?

## 2021-09-03 NOTE — Progress Notes (Addendum)
STROKE TEAM PROGRESS NOTE  ? ?INTERVAL HISTORY ?His sister and brother-in-law are at the bedside.  He lives with them and they state that he seems to be back to his baseline.  They agree with starting aspirin 81 mg.  He has been able to eat breakfast and he states that he wants to go home.  He is minimally cooperative with exam, will not hold his extremities in the air when asked ? ?Vitals:  ? 09/02/21 2325 09/03/21 0254 09/03/21 0459 09/03/21 2706  ?BP: 140/63 (!) 148/52  (!) 154/54  ?Pulse: 67 65  63  ?Resp: 18 14  12   ?Temp: 98.6 ?F (37 ?C) 97.7 ?F (36.5 ?C)  98.7 ?F (37.1 ?C)  ?TempSrc:  Oral  Oral  ?SpO2: 96% 98%  96%  ?Weight:   56 kg   ?Height:      ? ?CBC:  ?Recent Labs  ?Lab 09/01/21 ?1051 09/01/21 ?1102 09/02/21 ?0143  ?WBC 4.3  --  6.8  ?NEUTROABS 1.6*  --  3.0  ?HGB 12.7* 13.3 12.3*  ?HCT 39.7 39.0 39.6  ?MCV 82.9  --  83.9  ?PLT 183  --  195  ? ?Basic Metabolic Panel:  ?Recent Labs  ?Lab 09/02/21 ?0143 09/03/21 ?0128  ?NA 140 139  ?K 3.9 3.2*  ?CL 102 106  ?CO2 31 30  ?GLUCOSE 103* 90  ?BUN 9 10  ?CREATININE 1.46* 1.32*  ?CALCIUM 9.3 9.0  ?MG 1.9  --   ?PHOS  --  4.3  ? ?Lipid Panel:  ?Recent Labs  ?Lab 09/02/21 ?0143  ?CHOL 153  ?TRIG 77  ?HDL 48  ?CHOLHDL 3.2  ?VLDL 15  ?LDLCALC 90  ? ?HgbA1c:  ?Recent Labs  ?Lab 09/02/21 ?0143  ?HGBA1C 5.6  ? ?Urine Drug Screen:  ?Recent Labs  ?Lab 09/01/21 ?1115  ?LABOPIA NONE DETECTED  ?COCAINSCRNUR NONE DETECTED  ?LABBENZ NONE DETECTED  ?AMPHETMU NONE DETECTED  ?THCU NONE DETECTED  ?LABBARB NONE DETECTED  ?  ?Alcohol Level No results for input(s): ETH in the last 168 hours. ? ?IMAGING past 24 hours ?No results found. ? ?PHYSICAL EXAM ?GENERAL: sleeping comfortable in bed. NAD ?LUNGS - Clear to auscultation bilaterally with no wheezes ?CV - S1S2 RRR, no m/r/g, equal pulses bilaterally. ?  ?NEURO:  ?Mental Status: AA&O to self. Follows simple commands. Moves all extremities spontaneously ?Cranial Nerves: Pupils- legally blind, EOMI, visual fields no blink to threat,  no facial asymmetry, facial sensation intact, hearing intact, tongue/uvula/soft palate midline, normal sternocleidomastoid and trapezius muscle strength. No evidence of tongue atrophy or fibrillations ?Motor: 4/5 in left upper and 4/5 left lower, right upper 4/5 and right lower 4/5 ?Tone: is normal and bulk is normal ?Sensation- Intact to light touch bilaterally ?Coordination: unable to assess ?Gait- deferred ? ?ASSESSMENT/PLAN ?Hunter Chavez is a 71 y.o. male with history of HTN, HLD, developmental delay, Dementia, Seizure disorder, anxiety legally blind bilaterally who presented to [facility] for evaluation of right sided facial droop and noticeable leaning to the right.  Family states he has returned to his baseline and patient states he feels well.  ?  ?Code Stroke CT Head No acute intracranial finding by CT. 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 ?CTA head & neck normal vasculature of the head and neck ?MRI no acute intracranial abnormality ?2D Echo EF is 50-55% LV normal function ?LDL 90 ?HgbA1c 5.6 ?VTE prophylaxis -SCDs ?   ?Diet  ? DIET DYS 3 Room service appropriate? Yes;  Fluid consistency: Thin  ? ?No antithrombotic prior to admission, now on aspirin 81 mg daily.  ?Therapy recommendations: No follow-up ?Disposition: Home ? ?Hypertension ?Home meds: Amlodipine 5 mg ?Stable ?Permissive hypertension (OK if < 220/120) but gradually normalize in 5-7 days ?Long-term BP goal normotensive ? ?Hyperlipidemia ?Home meds: Atorvastatin 20 mg ?LDL 90, goal < 70 ?Add increased to 40 ?Continue statin at discharge ? ?Other Stroke Risk Factors ?Advanced Age >/= 37  ? ?Other Active Problems ?Seizures-on Keppra 500 mg ?AKI-baseline creatinine 1.14 ? Cr 1.46 -> 1.32 ?Hypokalemia-replete as indicated ?Chronic vision loss. 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 Patient follows up with ophthalmology outpatient. ?Anxiety ?Home regimen ordered by primary  team ? ?Hospital day # 1 ? ?Patient seen and examined by NP/APP with MD. MD to update note as needed.  ? ?Elmer Picker, DNP, FNP-BC ?[facility] ?Pager: [phone] ? ?ATTENDING ATTESTATION: ? ?71 year old gentleman with transient right-sided weakness likely TIA.  MRI is negative.  CTA is negative.  Echo shows mild left ventricular hypertrophy recommend follow-up outpatient.  He is to continue aspirin 81 mg daily.  Discussed lifestyle management with exercise and diet modifications.  He can follow-up in the stroke clinic outpatient.  His exam is largely nonfocal but is uncooperative and has significant baseline dementia.  Plan discussed with family who is present in the room.  Also discussed with him that he needs to increase his water intake to 1 L a day. ? ?Neurology will sign off please call with questions. ? ?Dr. Viviann Spare evaluated pt independently, reviewed imaging, chart, labs. Discussed and formulated plan with the APP. Please see APP note above for details.   Total 36 minutes spent on counseling patient and coordinating care, writing notes and reviewing chart. ? ? ?Harmani Neto,MD  ? ?To contact Stroke Continuity provider, please refer to [email or web address]. ?After hours, contact General Neurology ? ?

## 2021-09-03 NOTE — Discharge Summary (Signed)
?Physician Discharge Summary ?  ?Patient: Hunter Chavez MRN: [id number] DOB: 1951/04/30  ?Admit date:     09/01/2021  ?Discharge date: 09/03/2021  ?Discharge Physician: Lynden Oxford  ?PCP: Angelica Chessman, MD ? ?Recommendations at discharge: ?Follow-up with PCP in 1 week. ?Establish care with cardiology for aortic regurgitation. ? ?Discharge Diagnoses: ?Principal Problem: ?  Stroke-like symptoms ?Active Problems: ?  Essential hypertension ?  Elevated troponin ?  Hypokalemia ?  Sinus bradycardia ?  Anxiety ?  Seizures (HCC) ?  HLD (hyperlipidemia) ?  GERD (gastroesophageal reflux disease) ?  TIA (transient ischemic attack) ?  Aortic insufficiency ? ?Hospital Course: ?Hunter Chavez is a 71 y.o. male with medical history significant for hypertension, hyperlipidemia, developmental delay, dementia, GAD, seizures, legal blindness bilaterally, presented with complaints of right-sided facial droop.  Possibly having TIA.  MRI negative for stroke.  Work-up shows aortic insufficiency. ? ?Assessment and Plan: ?Stroke-like symptoms ?CT of the head unremarkable for any acute stroke. ?MRI brain also negative for any stroke. ?CT angio head and neck negative for any large vessel occlusion. ?At present appears to have a TIA. ?Not on any antithrombotic medication prior to admission.  Currently on aspirin. ?Given high risk factors patient will also be on 3 weeks of Plavix. ?LDL 90 goal less than 70, continue statin increase from pravastatin 20 mg to Lipitor 40 mg. ?Hemoglobin A1c 5.6. ?Passed swallowing evaluation. ?PT OT recommends no therapy requirement. ?Neurology currently signed off. ? ?Acute kidney injury. ?Baseline serum creatinine normal. ?On admission serum creatinine 1.14.  Currently trending up to 1.46. ?Encourage p.o. fluids. ? ?Aortic insufficiency. ?Seen incidentally on the echocardiogram. ?Outpatient referral to cardiology. ?No evidence of heart failure right now. ?  ?Essential hypertension ?Blood pressure  stable. ?Allowing permissive hypertension. ?  ?Elevated troponin ?Minimally elevated. ?Echocardiogram negative for any wall motion abnormality. ?Currently no chest pain. ?No further work-up. ?  ?Hypokalemia ?Replace. ?  ?Sinus bradycardia ?Monitor. ?Avoid beta-blockers. ?  ?Anxiety ?Continue home regimen. ?  ?Seizures (HCC) ?On Keppra 500 nightly. ?Monitor. ?  ?GERD (gastroesophageal reflux disease) ?Continue Pepcid ?  ?Chronic vision loss. 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 ?Patient follows up with ophthalmology outpatient.  No inpatient work-up necessary. ? ?Consultants: Neurology ?Procedures performed:  ?Echocardiogram  ?DISCHARGE MEDICATION: ?Allergies as of 09/03/2021   ? ?   Reactions  ? Penicillins Other (See Comments)  ? Reaction not known  ? ?  ? ?  ?Medication List  ?  ? ?STOP taking these medications   ? ?diltiazem 240 MG 24 hr capsule ?Commonly known as: CARDIZEM CD ?  ?pravastatin 20 MG tablet ?Commonly known as: PRAVACHOL ?  ? ?  ? ?TAKE these medications   ? ?amLODipine 5 MG tablet ?Commonly known as: NORVASC ?Take 1 tablet (5 mg total) by mouth daily. ?  ?ascorbic acid 500 MG tablet ?Commonly known as: VITAMIN C ?Take 500 mg by mouth at bedtime. ?  ?aspirin 81 MG EC tablet ?Take 1 tablet (81 mg total) by mouth daily. Swallow whole. ?Start taking on: Sep 04, 2021 ?  ?atorvastatin 40 MG tablet ?Commonly known as: LIPITOR ?Take 1 tablet (40 mg total) by mouth daily. ?Start taking on: Sep 04, 2021 ?  ?brimonidine 0.2 % ophthalmic solution ?Commonly known as: ALPHAGAN ?Place 1 drop into both eyes 3 (three) times daily. ?  ?donepezil 10 MG tablet ?Commonly known as: ARICEPT ?Take 20 mg by mouth in the morning. ?  ?dorzolamide-timolol 22.3-6.8 MG/ML ophthalmic solution ?Commonly known as: COSOPT ?Place  1 drop into both eyes 2 (two) times daily. ?  ?famotidine 10 MG tablet ?Commonly known as: PEPCID ?Take 10 mg by mouth in the morning. ?  ?High Potency Iron 65 MG Tabs ?Take 65 mg by  mouth at bedtime. ?  ?hydrOXYzine 25 MG tablet ?Commonly known as: ATARAX ?Take 25 mg by mouth at bedtime. ?  ?lamoTRIgine 25 MG tablet ?Commonly known as: LAMICTAL ?Take 25-50 mg by mouth See admin instructions. Take 25 mg by mouth in the morning and 50 mg at bedtime ?  ?latanoprost 0.005 % ophthalmic solution ?Commonly known as: XALATAN ?Place 1 drop into both eyes at bedtime. ?  ?levETIRAcetam 500 MG tablet ?Commonly known as: KEPPRA ?Take 500 mg by mouth at bedtime. ?  ?LORazepam 0.5 MG tablet ?Commonly known as: ATIVAN ?Take 0.5 mg by mouth in the morning. ?  ?melatonin 5 MG Tabs ?Take 5 mg by mouth daily as needed (to relax). ?  ?OLANZapine 7.5 MG tablet ?Commonly known as: ZYPREXA ?Take 7.5 mg by mouth at bedtime. ?  ?sertraline 50 MG tablet ?Commonly known as: ZOLOFT ?Take 50 mg by mouth in the morning. ?  ?Vitamin D3 50 MCG (2000 UT) Tabs ?Take 2,000 Units by mouth in the morning. ?  ? ?  ? ? Follow-up Information   ? ? Angelica Chessman, MD. Schedule an appointment as soon as possible for a visit in 1 week(s).   ?Specialty: Family Medicine ?Why: check BMP in 1 week ?refer to cardiology for Aortic regurgitation ?cardizem changed to amlodipine due to low heart rate. ?Contact information: ?[address] ?[address] ?[place] [address] ?[phone] ? ? ?  ?  ? ?  ?  ? ?  ? ?Disposition: Home ?Diet recommendation: Cardiac diet ? ?Discharge Exam: ?Vitals:  ? 09/02/21 2325 09/03/21 3244 09/03/21 0459 09/03/21 0102  ?BP: 140/63 (!) 148/52  (!) 154/54  ?Pulse: 67 65  63  ?Resp: 18 14  12   ?Temp: 98.6 ?F (37 ?C) 97.7 ?F (36.5 ?C)  98.7 ?F (37.1 ?C)  ?TempSrc:  Oral  Oral  ?SpO2: 96% 98%  96%  ?Weight:   56 kg   ?Height:      ? ?General: Appear in no distress; no visible Abnormal Neck Mass Or lumps, Conjunctiva normal ?Cardiovascular: S1 and S2 Present, no Murmur, ?Respiratory: good respiratory effort, Bilateral Air entry present and CTA, no Crackles, no wheezes ?Abdomen: Bowel Sound present, Non tender   ?Extremities: no Pedal edema ?Neurology: alert and oriented to self ?Gait not checked due to patient safety concerns ?Filed Weights  ? 09/01/21 1033 09/02/21 0544 09/03/21 0459  ?Weight: 59 kg 55.2 kg 56 kg  ? ?Condition at discharge: stable ? ?The results of significant diagnostics from this hospitalization (including imaging, microbiology, ancillary and laboratory) are listed below for reference.  ? ?Imaging Studies: ?CT ANGIO HEAD NECK W WO CM ? ?Result Date: 09/01/2021 ?CLINICAL DATA:  History of dementia, hypertension, visual impairment, seizures presenting with stroke-like symptoms since 7 p.m. EXAM: CT ANGIOGRAPHY HEAD AND NECK TECHNIQUE: Multidetector CT imaging of the head and neck was performed using the standard protocol during bolus administration of intravenous contrast. Multiplanar CT image reconstructions and MIPs were obtained to evaluate the vascular anatomy. Carotid stenosis measurements (when applicable) are obtained utilizing NASCET criteria, using the distal internal carotid diameter as the denominator. RADIATION DOSE REDUCTION: This exam was performed according to the departmental dose-optimization program which includes automated exposure control, adjustment of the mA and/or kV according to  patient size and/or use of iterative reconstruction technique. CONTRAST:  OMNIPAQUE IOHEXOL 350 MG/ML SOLN COMPARISON:  Same-day noncontrast CT head FINDINGS: CTA NECK FINDINGS Aortic arch: Imaged aortic arch is normal. The origins of the major branch vessels are patent. The subclavian arteries are patent to the level imaged. The left vertebral artery arises directly from the aortic arch, a normal variant. Right carotid system: The right common, internal, and external carotid arteries are patent, without hemodynamically significant stenosis or occlusion. There is no dissection or aneurysm. Left carotid system: The left common, internal, and external carotid arteries are patent with minimal plaque at  the bifurcation but no hemodynamically significant stenosis or occlusion. There is no dissection or aneurysm. Vertebral arteries: The vertebral arteries are patent, without hemodynamically significant stenosis or occlusion. The

## 2023-03-04 IMAGING — DX DG CHEST 2V
2 series · 2 of 2 positions shown · non-contrast
Comparison: 03/21/2021

CLINICAL DATA: Encounter for pneumonia, aspiration.

EXAM:
CHEST - 2 VIEW

[chest lat]
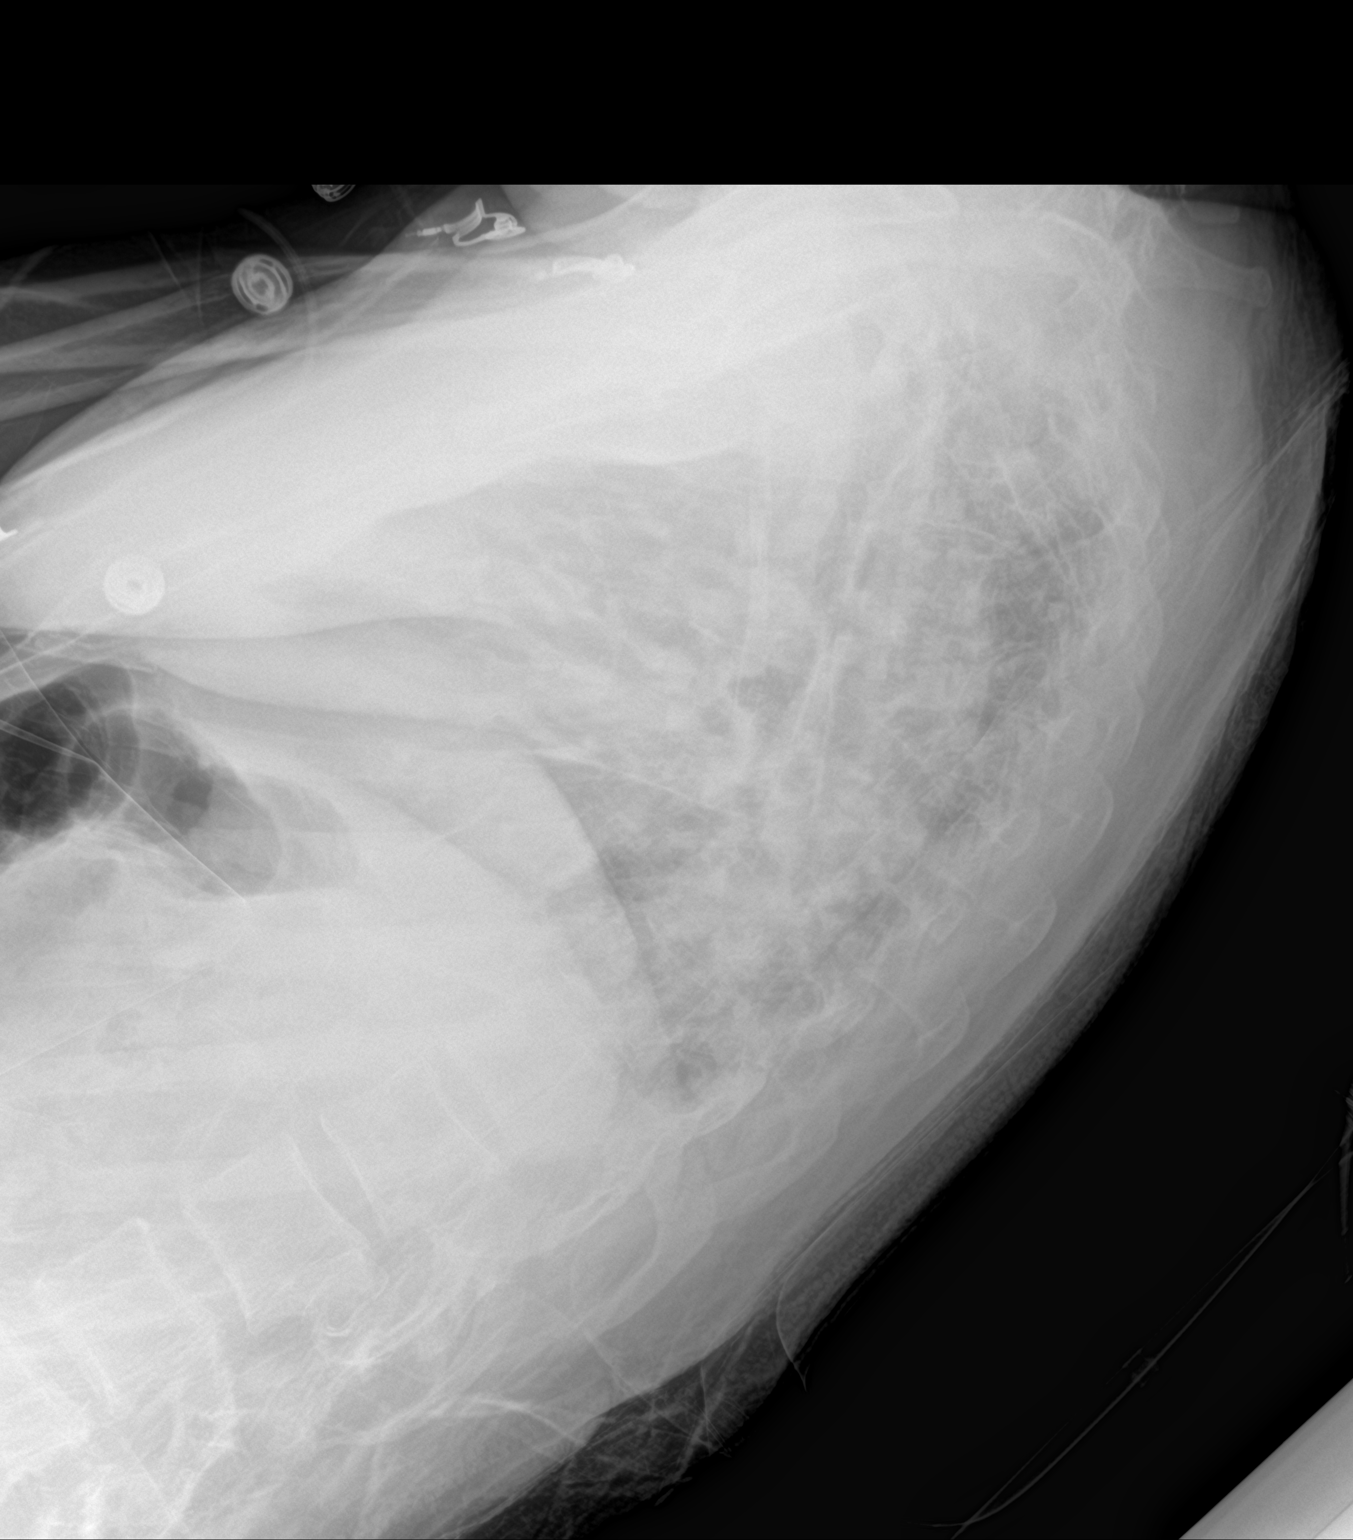

[chest ap]
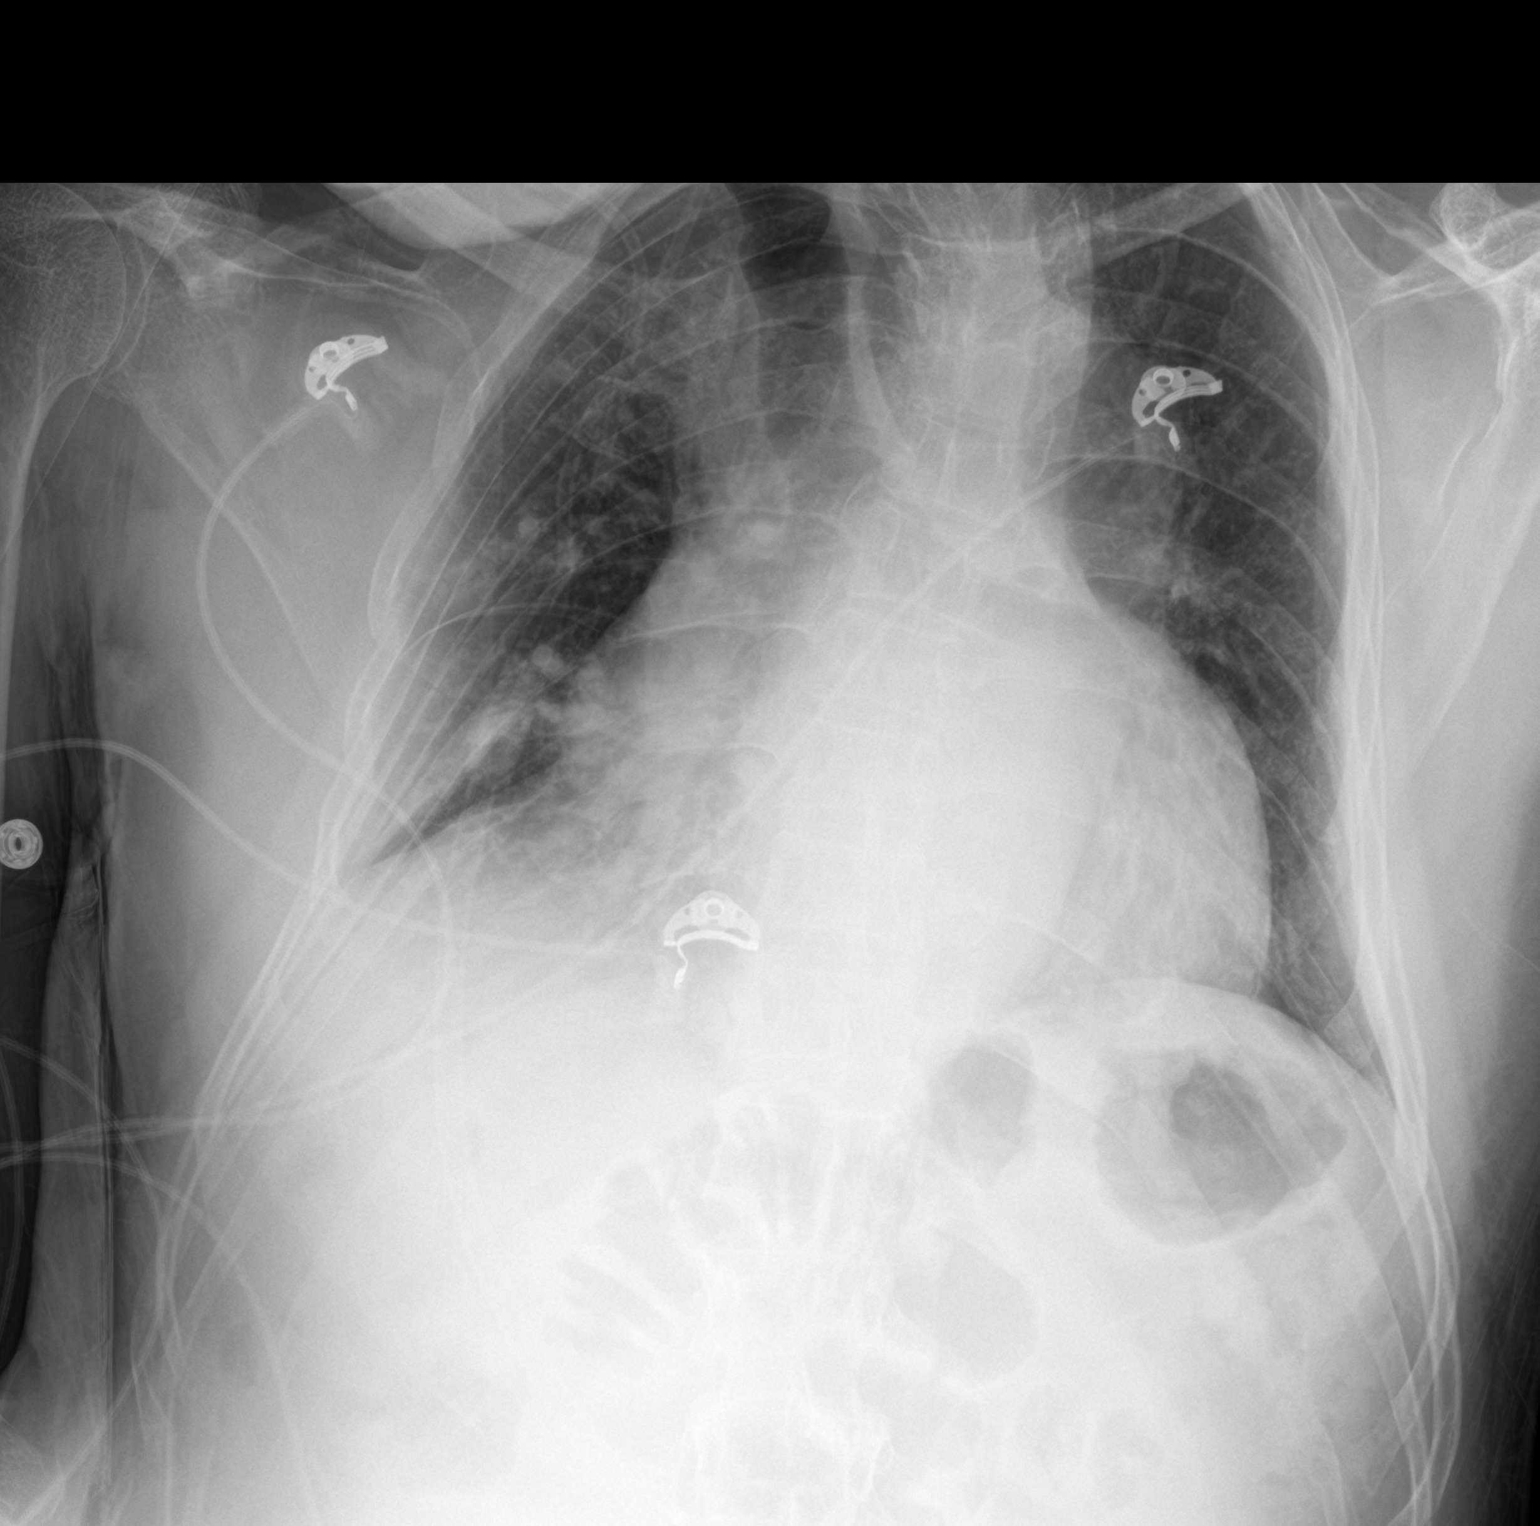

[2 of 2 positions shown; findings below may reference images not displayed]

FINDINGS: Cardiomegaly. Focal airspace opacity in the right lower lobe. Left
lung clear. No effusions or overt edema. No acute bony abnormality.
IMPRESSION: Focal opacity in the right lower lobe concerning for pneumonia.

Cardiomegaly.

## 2023-09-29 DEATH — deceased
